# Patient Record
Sex: Male | Born: 1950 | Race: White | Hispanic: No | Smoking: Never smoker
Health system: Southern US, Community
[De-identification: ages and names within clinical notes are randomized; demographics above are authoritative.]

## PROBLEM LIST (undated history)

## (undated) DIAGNOSIS — Z87442 Personal history of urinary calculi: Secondary | ICD-10-CM

## (undated) DIAGNOSIS — F32A Depression, unspecified: Secondary | ICD-10-CM

## (undated) DIAGNOSIS — M1990 Unspecified osteoarthritis, unspecified site: Secondary | ICD-10-CM

## (undated) DIAGNOSIS — R339 Retention of urine, unspecified: Secondary | ICD-10-CM

## (undated) DIAGNOSIS — F329 Major depressive disorder, single episode, unspecified: Secondary | ICD-10-CM

## (undated) DIAGNOSIS — N200 Calculus of kidney: Secondary | ICD-10-CM

## (undated) DIAGNOSIS — N400 Enlarged prostate without lower urinary tract symptoms: Secondary | ICD-10-CM

## (undated) DIAGNOSIS — E785 Hyperlipidemia, unspecified: Secondary | ICD-10-CM

## (undated) DIAGNOSIS — F419 Anxiety disorder, unspecified: Secondary | ICD-10-CM

## (undated) DIAGNOSIS — R011 Cardiac murmur, unspecified: Secondary | ICD-10-CM

## (undated) DIAGNOSIS — I10 Essential (primary) hypertension: Secondary | ICD-10-CM

## (undated) HISTORY — DX: Depression, unspecified: F32.A

## (undated) HISTORY — DX: Cardiac murmur, unspecified: R01.1

## (undated) HISTORY — PX: TONSILLECTOMY: SUR1361

## (undated) HISTORY — DX: Benign prostatic hyperplasia without lower urinary tract symptoms: N40.0

## (undated) HISTORY — DX: Unspecified osteoarthritis, unspecified site: M19.90

## (undated) HISTORY — DX: Hyperlipidemia, unspecified: E78.5

## (undated) HISTORY — DX: Personal history of urinary calculi: Z87.442

## (undated) HISTORY — PX: CARDIAC SURGERY: SHX584

## (undated) HISTORY — DX: Major depressive disorder, single episode, unspecified: F32.9

## (undated) HISTORY — DX: Calculus of kidney: N20.0

## (undated) HISTORY — DX: Retention of urine, unspecified: R33.9

## (undated) HISTORY — DX: Anxiety disorder, unspecified: F41.9

---

## 2011-12-04 ENCOUNTER — Emergency Department: Payer: Self-pay | Admitting: Emergency Medicine

## 2014-04-14 DIAGNOSIS — Z8719 Personal history of other diseases of the digestive system: Secondary | ICD-10-CM | POA: Insufficient documentation

## 2014-05-05 ENCOUNTER — Ambulatory Visit: Payer: Self-pay | Admitting: Gastroenterology

## 2014-05-22 ENCOUNTER — Emergency Department: Payer: Self-pay | Admitting: Emergency Medicine

## 2014-06-14 DIAGNOSIS — L0291 Cutaneous abscess, unspecified: Secondary | ICD-10-CM | POA: Insufficient documentation

## 2014-06-14 DIAGNOSIS — K5792 Diverticulitis of intestine, part unspecified, without perforation or abscess without bleeding: Secondary | ICD-10-CM | POA: Insufficient documentation

## 2014-06-14 DIAGNOSIS — R509 Fever, unspecified: Secondary | ICD-10-CM | POA: Insufficient documentation

## 2014-06-14 DIAGNOSIS — I10 Essential (primary) hypertension: Secondary | ICD-10-CM | POA: Insufficient documentation

## 2014-06-15 DIAGNOSIS — M545 Low back pain, unspecified: Secondary | ICD-10-CM | POA: Insufficient documentation

## 2014-06-21 DIAGNOSIS — I330 Acute and subacute infective endocarditis: Secondary | ICD-10-CM | POA: Insufficient documentation

## 2014-06-30 LAB — SURGICAL PATHOLOGY

## 2014-08-21 DIAGNOSIS — I340 Nonrheumatic mitral (valve) insufficiency: Secondary | ICD-10-CM | POA: Insufficient documentation

## 2014-08-21 DIAGNOSIS — Z952 Presence of prosthetic heart valve: Secondary | ICD-10-CM | POA: Insufficient documentation

## 2014-08-21 DIAGNOSIS — G061 Intraspinal abscess and granuloma: Secondary | ICD-10-CM | POA: Insufficient documentation

## 2014-08-21 DIAGNOSIS — F32A Depression, unspecified: Secondary | ICD-10-CM | POA: Insufficient documentation

## 2014-08-21 DIAGNOSIS — I639 Cerebral infarction, unspecified: Secondary | ICD-10-CM | POA: Insufficient documentation

## 2014-08-21 DIAGNOSIS — F329 Major depressive disorder, single episode, unspecified: Secondary | ICD-10-CM | POA: Insufficient documentation

## 2015-03-14 ENCOUNTER — Encounter: Payer: Self-pay | Admitting: Emergency Medicine

## 2015-03-14 ENCOUNTER — Emergency Department: Payer: Medicare Other

## 2015-03-14 ENCOUNTER — Emergency Department
Admission: EM | Admit: 2015-03-14 | Discharge: 2015-03-14 | Disposition: A | Discharge status: Home / Self Care | Payer: Medicare Other | Attending: Student | Admitting: Student

## 2015-03-14 DIAGNOSIS — I10 Essential (primary) hypertension: Secondary | ICD-10-CM | POA: Diagnosis not present

## 2015-03-14 DIAGNOSIS — R1011 Right upper quadrant pain: Secondary | ICD-10-CM | POA: Diagnosis present

## 2015-03-14 DIAGNOSIS — R1013 Epigastric pain: Secondary | ICD-10-CM

## 2015-03-14 DIAGNOSIS — N200 Calculus of kidney: Secondary | ICD-10-CM

## 2015-03-14 DIAGNOSIS — R52 Pain, unspecified: Secondary | ICD-10-CM

## 2015-03-14 DIAGNOSIS — R1111 Vomiting without nausea: Secondary | ICD-10-CM

## 2015-03-14 HISTORY — DX: Essential (primary) hypertension: I10

## 2015-03-14 LAB — CBC WITH DIFFERENTIAL/PLATELET
Basophils Absolute: 0.1 10*3/uL (ref 0–0.1)
Basophils Relative: 1 %
EOS PCT: 0 %
Eosinophils Absolute: 0 10*3/uL (ref 0–0.7)
HCT: 37.8 % — ABNORMAL LOW (ref 40.0–52.0)
Hemoglobin: 12.6 g/dL — ABNORMAL LOW (ref 13.0–18.0)
LYMPHS ABS: 0.6 10*3/uL — AB (ref 1.0–3.6)
LYMPHS PCT: 5 %
MCH: 28.7 pg (ref 26.0–34.0)
MCHC: 33.2 g/dL (ref 32.0–36.0)
MCV: 86.5 fL (ref 80.0–100.0)
MONO ABS: 0.7 10*3/uL (ref 0.2–1.0)
Monocytes Relative: 6 %
Neutro Abs: 11.1 10*3/uL — ABNORMAL HIGH (ref 1.4–6.5)
Neutrophils Relative %: 88 %
PLATELETS: 297 10*3/uL (ref 150–440)
RBC: 4.37 MIL/uL — ABNORMAL LOW (ref 4.40–5.90)
RDW: 14.5 % (ref 11.5–14.5)
WBC: 12.6 10*3/uL — ABNORMAL HIGH (ref 3.8–10.6)

## 2015-03-14 LAB — COMPREHENSIVE METABOLIC PANEL
ALT: 22 U/L (ref 17–63)
ANION GAP: 6 (ref 5–15)
AST: 28 U/L (ref 15–41)
Albumin: 3.9 g/dL (ref 3.5–5.0)
Alkaline Phosphatase: 66 U/L (ref 38–126)
BUN: 25 mg/dL — ABNORMAL HIGH (ref 6–20)
CALCIUM: 8.9 mg/dL (ref 8.9–10.3)
CHLORIDE: 106 mmol/L (ref 101–111)
CO2: 23 mmol/L (ref 22–32)
CREATININE: 1.16 mg/dL (ref 0.61–1.24)
Glucose, Bld: 146 mg/dL — ABNORMAL HIGH (ref 65–99)
Potassium: 4.1 mmol/L (ref 3.5–5.1)
Sodium: 135 mmol/L (ref 135–145)
Total Bilirubin: 0.4 mg/dL (ref 0.3–1.2)
Total Protein: 6.7 g/dL (ref 6.5–8.1)

## 2015-03-14 LAB — URINALYSIS COMPLETE WITH MICROSCOPIC (ARMC ONLY)
BACTERIA UA: NONE SEEN
Bilirubin Urine: NEGATIVE
GLUCOSE, UA: NEGATIVE mg/dL
HGB URINE DIPSTICK: NEGATIVE
LEUKOCYTES UA: NEGATIVE
NITRITE: NEGATIVE
PH: 6 (ref 5.0–8.0)
Protein, ur: NEGATIVE mg/dL
SPECIFIC GRAVITY, URINE: 1.024 (ref 1.005–1.030)
SQUAMOUS EPITHELIAL / LPF: NONE SEEN

## 2015-03-14 LAB — PROTIME-INR
INR: 1.13
PROTHROMBIN TIME: 14.7 s (ref 11.4–15.0)

## 2015-03-14 LAB — APTT: APTT: 24 s (ref 24–36)

## 2015-03-14 LAB — MAGNESIUM: MAGNESIUM: 1.9 mg/dL (ref 1.7–2.4)

## 2015-03-14 LAB — ETHANOL

## 2015-03-14 LAB — LIPASE, BLOOD: LIPASE: 36 U/L (ref 11–51)

## 2015-03-14 MED ORDER — OXYCODONE HCL 5 MG PO TABS: 5.0000 mg | ORAL_TABLET | Freq: Four times a day (QID) | ORAL | Status: DC | PRN

## 2015-03-14 MED ORDER — ONDANSETRON HCL 4 MG/2ML IJ SOLN
4.0000 mg | Freq: Once | INTRAMUSCULAR | Status: AC
  Administered 2015-03-14: 4 mg via INTRAVENOUS
  Filled 2015-03-14: qty 2

## 2015-03-14 MED ORDER — MORPHINE SULFATE (PF) 4 MG/ML IV SOLN
4.0000 mg | Freq: Once | INTRAVENOUS | Status: AC
  Administered 2015-03-14: 4 mg via INTRAVENOUS
  Filled 2015-03-14: qty 1

## 2015-03-14 MED ORDER — ONDANSETRON 4 MG PO TBDP: 4.0000 mg | ORAL_TABLET | Freq: Three times a day (TID) | ORAL | Status: DC | PRN

## 2015-03-14 MED ORDER — SODIUM CHLORIDE 0.9 % IV BOLUS (SEPSIS)
500.0000 mL | Freq: Once | INTRAVENOUS | Status: AC
  Administered 2015-03-14: 500 mL via INTRAVENOUS

## 2015-03-14 MED ORDER — TAMSULOSIN HCL 0.4 MG PO CAPS: 0.4000 mg | ORAL_CAPSULE | Freq: Every day | ORAL | Status: DC

## 2015-03-14 NOTE — ED Notes (Signed)
Pt arrived by EMS with c/o of upper right quadrant pain. States it started around midnight and he has vomited twice. Hx of HTN, and valve replacement a month ago. Per EMS pt has a hx of EOTH use but pt states he has had little to drink over the past few days. VS's WDL.

## 2015-03-14 NOTE — ED Notes (Signed)
Pt verbalized understanding of discharge instructions. NAD at this time. 

## 2015-03-14 NOTE — ED Provider Notes (Signed)
[facility] Provider Note  ____________________________________________  Time seen: Approximately 8:31 AM  I have reviewed the triage vital signs and the nursing notes.   HISTORY  Chief Complaint Abdominal Pain    HPI [facility] is a 65 y.o. 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 He also has had 2 episodes of nonbloody nonbilious emesis. No diarrhea. No chest pain or difficulty breathing, no coughing, sneezing, runny nose, congestion. He has had some dysuria. He reports he drinks alcohol daily, including "a few sips" earlier this morning.   Past Medical History  Diagnosis Date  . Hypertension     There are no active problems to display for this patient.   Past Surgical History  Procedure Laterality Date  . Cardiac surgery      No current outpatient prescriptions on file.  Allergies Review of patient's allergies indicates no known allergies.  History reviewed. No pertinent family history.  Social History Social History  Substance Use Topics  . Smoking status: Never Smoker   . Smokeless tobacco: None  . Alcohol Use: Yes    Review of Systems Constitutional: No fever/chills Eyes: No visual changes. ENT: No sore throat. Cardiovascular: Denies chest pain. Respiratory: Denies shortness of breath. Gastrointestinal: + abdominal pain.  + nausea, n+ vomiting.  No diarrhea.  No constipation. Genitourinary: Positive for dysuria. Musculoskeletal: Negative for back pain. Skin: Negative for rash. Neurological: Negative for headaches, focal weakness or  numbness.  10-point ROS otherwise negative.  ____________________________________________   PHYSICAL EXAM:  VITAL SIGNS: ED Triage Vitals  Enc Vitals Group     BP 03/14/15 0833 133/97 mmHg     Pulse Rate 03/14/15 0833 76     Resp 03/14/15 0833 16     Temp 03/14/15 0833 97.4 F (36.3 C)     Temp Source 03/14/15 0833 Oral     SpO2 --      Weight 03/14/15 0833 150 lb (68.04 kg)     Height 03/14/15 0833 5\' 6"  (1.676 m)     Head Cir --      Peak Flow --      Pain Score 03/14/15 0838 2     Pain Loc --      Pain Edu? --      Excl. in [place]? --     Constitutional: Alert and oriented. Nontoxic appearing and in no acute distress. Eyes: Conjunctivae are normal. PERRL. EOMI. Head: Atraumatic. Nose: No congestion/rhinnorhea. Mouth/Throat: Mucous membranes are moist.  Oropharynx non-erythematous. Neck: No stridor.   Cardiovascular: Normal rate, regular rhythm. Grossly normal heart sounds.  Good peripheral circulation. Respiratory: Normal respiratory effort.  No retractions. Lungs CTAB. Gastrointestinal: Soft with faint tenderness to palpation in the epigastrium and the right upper quadrant, no rebound, no guarding. No tenderness in the right lower quadrant. No distention.  No CVA tenderness. Genitourinary: deferred Musculoskeletal: No lower extremity tenderness nor edema.  No joint effusions. Neurologic:  Normal speech and language. No gross focal neurologic deficits are appreciated. No gait instability. Skin:  Skin is warm, dry and intact. No rash noted. Psychiatric: Mood and affect are normal. Speech and behavior are normal.  ____________________________________________   LABS (all labs ordered are listed,  but only abnormal results are displayed)  Labs Reviewed  URINALYSIS COMPLETEWITH MICROSCOPIC ([place] ONLY) - Abnormal; Notable for the following:    Color, Urine YELLOW (*)    APPearance CLEAR (*)    Ketones, ur 2+ (*)    All other components within normal limits   COMPREHENSIVE METABOLIC PANEL - Abnormal; Notable for the following:    Glucose, Bld 146 (*)    BUN 25 (*)    All other components within normal limits  CBC WITH DIFFERENTIAL/PLATELET - Abnormal; Notable for the following:    WBC 12.6 (*)    RBC 4.37 (*)    Hemoglobin 12.6 (*)    HCT 37.8 (*)    Neutro Abs 11.1 (*)    Lymphs Abs 0.6 (*)    All other components within normal limits  MAGNESIUM  LIPASE, BLOOD  PROTIME-INR  APTT  CBC WITH DIFFERENTIAL/PLATELET  ETHANOL   ____________________________________________  EKG  ED ECG REPORT I, Joanne Gavel, the attending physician, personally viewed and interpreted this ECG.   Date: 03/14/2015  EKG Time: 08:34  Rate: 75  Rhythm: normal sinus rhythm  Axis: Normal axis  Intervals: QTc is slightly prolonged at 504 ms.  ST&T Change: No acute ST elevation. No specific T wave abnormality in lead 3, V3.  ____________________________________________  RADIOLOGY  RUQ ultrasound ____________________________________________   PROCEDURES  Procedure(s) performed: None  Critical Care performed: No  ____________________________________________   INITIAL IMPRESSION / ASSESSMENT AND PLAN / ED COURSE  Pertinent labs & imaging results that were available during my care of the patient were reviewed by me and considered in my medical decision making (see chart for details).  [name] is a 65 y.o. 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 On exam, he is nontoxic appearing and in no acute distress. Vital signs are stable, he is afebrile. He does have faint tenderness to palpation in the epigastrium and the right upper quadrant, no rebound, no guarding. We'll treat his pain, plan for screening labs  and UA, right upper quadrant ultrasound, reassess for disposition.  ----------------------------------------- 11:40 AM on 03/14/2015 -----------------------------------------  Reports significant improvement of his pain at this time. He is sitting up in bed, tolerating by mouth intake. Labs reviewed. Normal CMP, lipase, INR 1.13, CBC notable for mild leukocytosis and mild anemia. Normal magnesium level. Right upper quadrant ultrasound was normal. Urinalysis is not consistent with infection but many red blood cells were noted. CT of the abdomen and pelvis, renal protocol, was obtained and revealed 4 mm distal right ureteral stone resulting in mild right hydroureteronephrosis. The patient continues to appear well. His pain is well controlled. We discussed expectant management, return precautions, need for close urology follow-up, and he is comfortable with the discharge plan. DC home. Of note, his ethanol level is pending but he is clinically sober and has a friend here who will drive him home. ____________________________________________   FINAL CLINICAL IMPRESSION(S) / ED DIAGNOSES  Final diagnoses:  Pain  Acute epigastric pain  RUQ pain  Non-intractable vomiting without nausea, unspecified vomiting type  Nephrolithiasis      Joanne Gavel, MD 03/14/15 1143

## 2015-04-13 ENCOUNTER — Encounter: Payer: Self-pay | Admitting: *Deleted

## 2015-04-16 ENCOUNTER — Ambulatory Visit (INDEPENDENT_AMBULATORY_CARE_PROVIDER_SITE_OTHER): Payer: Medicare Other | Admitting: Urology

## 2015-04-16 ENCOUNTER — Encounter: Payer: Self-pay | Admitting: Urology

## 2015-04-16 VITALS — BP 132/91 | HR 91 | Ht 66.0 in | Wt 169.1 lb

## 2015-04-16 DIAGNOSIS — N132 Hydronephrosis with renal and ureteral calculous obstruction: Secondary | ICD-10-CM

## 2015-04-16 DIAGNOSIS — N401 Enlarged prostate with lower urinary tract symptoms: Secondary | ICD-10-CM

## 2015-04-16 DIAGNOSIS — N138 Other obstructive and reflux uropathy: Secondary | ICD-10-CM

## 2015-04-16 DIAGNOSIS — N201 Calculus of ureter: Secondary | ICD-10-CM | POA: Diagnosis not present

## 2015-04-16 LAB — URINALYSIS, COMPLETE
BILIRUBIN UA: NEGATIVE
GLUCOSE, UA: NEGATIVE
KETONES UA: NEGATIVE
Leukocytes, UA: NEGATIVE
NITRITE UA: NEGATIVE
PH UA: 7 (ref 5.0–7.5)
Protein, UA: NEGATIVE
RBC UA: NEGATIVE
Specific Gravity, UA: 1.015 (ref 1.005–1.030)
UUROB: 0.2 mg/dL (ref 0.2–1.0)

## 2015-04-16 LAB — MICROSCOPIC EXAMINATION
BACTERIA UA: NONE SEEN
Epithelial Cells (non renal): NONE SEEN /hpf (ref 0–10)
RBC, UA: NONE SEEN /hpf (ref 0–?)

## 2015-04-16 NOTE — Progress Notes (Signed)
04/16/2015 11:35 AM   [facility] March 20, 1950 [id number]  Referring provider: Ellamae Sia, MD [place], [place] [address]  Chief Complaint  Patient presents with  . Nephrolithiasis    kidney stone follow up    HPI: Patient is a 65 year old Caucasian male who was seen a [facility]'s ED on 03/14/2015 for right abdominal pain with vomiting.  Non- contrast CT scan noted a 4 mm stone within the distal right ureter resulting in mild right hydroureteronephrosis.   He also was found to have multiple additional bilateral renal stones.    He does not recall passing a stone.  He has not had further flank pain.  He is not experiencing gross hematuria, dysuria or suprapubic pain.  He does not complain of fevers, chills, nausea and vomiting.  His UA is unremarkable.    Patient has baseline frequent urination, urgency, nocturia and postvoid dribbling. He is I PSS score is 11/3.  He is currently on tamsulosin 0.4 mg daily.  This was prescribed to him by the emergency room department.      IPSS      04/16/15 1100       International Prostate Symptom Score   How often have you had the sensation of not emptying your bladder? Not at All     How often have you had to urinate less than every two hours? More than half the time     How often have you found you stopped and started again several times when you urinated? Less than 1 in 5 times     How often have you found it difficult to postpone urination? Less than half the time     How often have you had a weak urinary stream? Less than 1 in 5 times     How often have you had to strain to start urination? Not at All     How many times did you typically get up at night to urinate? 3 Times     Total IPSS Score 11     Quality of Life due to urinary symptoms   If you were to spend the rest of your life with your urinary condition just the way it is now how would you feel about that? Mixed        Score:  1-7 Mild 8-19  Moderate 20-35 Severe  PMH: Past Medical History  Diagnosis Date  . Hypertension   . Anxiety   . Depression   . Heart murmur   . HLD (hyperlipidemia)   . History of nephrolithiasis   . Incomplete bladder emptying   . BPH (benign prostatic hyperplasia)   . DJD (degenerative joint disease)     spine  . Bilateral kidney stones     Surgical History: Past Surgical History  Procedure Laterality Date  . Cardiac surgery    . Tonsillectomy      Home Medications:    Medication List       This list is accurate as of: 04/16/15 11:35 AM.  Always use your most recent med list.               acetaminophen 325 MG tablet  Commonly known as:  TYLENOL  Take 650 mg by mouth. Reported on 04/16/2015     aspirin 81 MG chewable tablet  Chew 324 mg by mouth.     atorvastatin 40 MG tablet  Commonly known as:  LIPITOR  Take by mouth. Reported on  04/16/2015     DULoxetine 20 MG capsule  Commonly known as:  CYMBALTA  Take 20 mg by mouth.     ondansetron 4 MG disintegrating tablet  Commonly known as:  ZOFRAN ODT  Take 1 tablet (4 mg total) by mouth every 8 (eight) hours as needed for nausea or vomiting.     oxyCODONE 5 MG immediate release tablet  Commonly known as:  ROXICODONE  Take 1 tablet (5 mg total) by mouth every 6 (six) hours as needed for moderate pain. Do not drive or drink alcohol while taking this medication.     polyethylene glycol powder powder  Commonly known as:  GLYCOLAX/MIRALAX  Reported on 04/16/2015     quinapril 10 MG tablet  Commonly known as:  ACCUPRIL  Take by mouth. Reported on 04/16/2015     tamsulosin 0.4 MG Caps capsule  Commonly known as:  FLOMAX  Take 1 capsule (0.4 mg total) by mouth daily.     TOPROL XL 25 MG 24 hr tablet  Generic drug:  metoprolol succinate  Take 25 mg by mouth.        Allergies: No Known Allergies  Family History: Family History  Problem Relation Age of Onset  . Prostate cancer Father   . Kidney disease Father      Social History:  reports that he has never smoked. He does not have any smokeless tobacco history on file. He reports that he drinks alcohol. He reports that he uses illicit drugs (Marijuana).  ROS: UROLOGY Frequent Urination?: Yes Hard to postpone urination?: Yes Burning/pain with urination?: No Get up at night to urinate?: Yes Leakage of urine?: Yes Urine stream starts and stops?: No Trouble starting stream?: No Do you have to strain to urinate?: No Blood in urine?: No Urinary tract infection?: No Sexually transmitted disease?: No Injury to kidneys or bladder?: No Painful intercourse?: No Weak stream?: No Erection problems?: Yes Penile pain?: No  Gastrointestinal Nausea?: No Vomiting?: No Indigestion/heartburn?: No Diarrhea?: No Constipation?: No  Constitutional Fever: No Night sweats?: No Weight loss?: No Fatigue?: No  Skin Skin rash/lesions?: No Itching?: No  Eyes Blurred vision?: Yes Double vision?: No  Ears/Nose/Throat Sore throat?: No Sinus problems?: No  Hematologic/Lymphatic Swollen glands?: No Easy bruising?: No  Cardiovascular Leg swelling?: No Chest pain?: No  Respiratory Cough?: No Shortness of breath?: No  Endocrine Excessive thirst?: Yes  Musculoskeletal Back pain?: No Joint pain?: No  Neurological Headaches?: No Dizziness?: No  Psychologic Depression?: No Anxiety?: No  Physical Exam: BP 132/91 mmHg  Pulse 91  Ht 5\' 6"  (1.676 m)  Wt 169 lb 1.6 oz (76.703 kg)  BMI 27.31 kg/m2  Constitutional: Well nourished. Alert and oriented, No acute distress. HEENT: [place] AT, moist mucus membranes. Trachea midline, no masses. Cardiovascular: No clubbing, cyanosis, or edema. Respiratory: Normal respiratory effort, no increased work of breathing. GI: Abdomen is soft, non tender, non distended, no abdominal masses. Liver and spleen not palpable.  No hernias appreciated.  Stool sample for occult testing is not indicated.   GU: No  CVA tenderness.  No bladder fullness or masses.  Patient with circumcised phallus.   Urethral meatus is patent.  No penile discharge. No penile lesions or rashes. Scrotum without lesions, cysts, rashes and/or edema.  Testicles are located scrotally bilaterally. No masses are appreciated in the testicles. Left and right epididymis are normal. Rectal: Patient with  normal sphincter tone. Anus and perineum without scarring or rashes. No rectal masses are appreciated. Prostate is approximately  60 grams, no nodules are appreciated. Seminal vesicles are normal. Skin: No rashes, bruises or suspicious lesions. Lymph: No cervical or inguinal adenopathy. Neurologic: Grossly intact, no focal deficits, moving all 4 extremities. Psychiatric: Normal mood and affect.  Laboratory Data: Lab Results  Component Value Date   WBC 12.6* 03/14/2015   HGB 12.6* 03/14/2015   HCT 37.8* 03/14/2015   MCV 86.5 03/14/2015   PLT 297 03/14/2015   Lab Results  Component Value Date   CREATININE 1.16 03/14/2015   Lab Results  Component Value Date   AST 28 03/14/2015   Lab Results  Component Value Date   ALT 22 03/14/2015   PSA History  2.3 ng/mL on 06/06/2014  Urinalysis  Results for orders placed or performed in visit on 04/16/15  Microscopic Examination  Result Value Ref Range   WBC, UA 0-5 0 -  5 /hpf   RBC, UA None seen 0 -  2 /hpf   Epithelial Cells (non renal) None seen 0 - 10 /hpf   Bacteria, UA None seen None seen/Few   Urinalysis, Complete  Result Value Ref Range   Specific Gravity, UA 1.015 1.005 - 1.030   pH, UA 7.0 5.0 - 7.5   Color, UA Yellow Yellow   Appearance Ur Clear Clear   Leukocytes, UA Negative Negative   Protein, UA Negative Negative/Trace   Glucose, UA Negative Negative   Ketones, UA Negative Negative   RBC, UA Negative Negative   Bilirubin, UA Negative Negative   Urobilinogen, Ur 0.2 0.2 - 1.0 mg/dL   Nitrite, UA Negative Negative   Microscopic Examination See below:      Pertinent Imaging: CLINICAL DATA: Patient with right upper quadrant pain. Vomiting.  EXAM: CT ABDOMEN AND PELVIS WITHOUT CONTRAST  TECHNIQUE: Multidetector CT imaging of the abdomen and pelvis was performed following the standard protocol without IV contrast.  COMPARISON: CT abdomen pelvis 05/22/2014.  FINDINGS: Lower chest: Normal heart size. Minimal atelectasis left lung base. No pleural effusion.  Hepatobiliary: Liver is normal in size contour. Gallbladder is unremarkable. No biliary ductal dilatation.  Pancreas: Unremarkable  Spleen: Unremarkable  Adrenals/Urinary Tract: The adrenal glands are normal. Tiny 2-3 mm bilateral renal stones are demonstrated. Additionally there is mild right hydroureteronephrosis to the level of the distal ureter were there is an obstructing stone (image 66; series 2). Urinary bladder is decompressed.  Stomach/Bowel: Sigmoid colonic diverticulosis. No CT evidence for acute diverticulitis. No evidence for bowel obstruction. Normal morphology to the stomach. No free fluid or free intraperitoneal air.  Vascular/Lymphatic: Normal caliber abdominal aorta. Peripheral calcified atherosclerotic plaque. No retroperitoneal lymphadenopathy.  Other: Prostate unremarkable.  Musculoskeletal: Lumbar spine degenerative changes. No aggressive or acute appearing osseous lesions.  IMPRESSION: There is a 4 mm stone within the distal right ureter resulting in mild right hydroureteronephrosis.  Multiple additional bilateral renal stones are demonstrated.   Electronically Signed  By: Lovey Newcomer M.D.  On: 03/14/2015 11:15        Assessment & Plan:    1. Right ureteral stone:   Patient is not aware if he has passed the stone.  His UA was unremarkable on today's exam.  We will obtain a RUS to see if the right hydronephrosis has resolved.    2. BPH (benign prostatic hyperplasia) with LUTS:   IPSS score is 11/3.  He was  started on tamsulosin by the ED for the stone, but he has noticed an improvement in his LUTS.  He will continue the medication.    -  PSA - Urinalysis, Complete  3.  Right hydronephrosis:   Patient will be undergoing a RUS in the future.     Return in about 1 week (around 04/23/2015) for RUS report.  These notes generated with voice recognition software. I apologize for typographical errors.  Zara Council, [facility] [address] [place], [place] [address] [phone]

## 2015-04-17 LAB — PSA: PROSTATE SPECIFIC AG, SERUM: 2.4 ng/mL (ref 0.0–4.0)

## 2015-04-20 DIAGNOSIS — N201 Calculus of ureter: Secondary | ICD-10-CM | POA: Insufficient documentation

## 2015-04-20 DIAGNOSIS — N401 Enlarged prostate with lower urinary tract symptoms: Secondary | ICD-10-CM

## 2015-04-20 DIAGNOSIS — N138 Other obstructive and reflux uropathy: Secondary | ICD-10-CM | POA: Insufficient documentation

## 2015-04-20 DIAGNOSIS — N132 Hydronephrosis with renal and ureteral calculous obstruction: Secondary | ICD-10-CM | POA: Insufficient documentation

## 2015-04-21 ENCOUNTER — Telehealth: Payer: Self-pay

## 2015-04-21 NOTE — Telephone Encounter (Signed)
-----   Message from Nori Riis, PA-C sent at 04/21/2015  8:42 AM EST ----- Patient's PSA is normal.  He has a RUS pending.

## 2015-04-21 NOTE — Telephone Encounter (Signed)
Spoke with pt in reference to PSA and RUS. Pt voiced understanding.

## 2015-05-15 ENCOUNTER — Ambulatory Visit
Admission: RE | Admit: 2015-05-15 | Discharge: 2015-05-15 | Disposition: A | Discharge status: Home / Self Care | Payer: Medicare Other | Source: Ambulatory Visit | Attending: Urology | Admitting: Urology

## 2015-05-15 DIAGNOSIS — N132 Hydronephrosis with renal and ureteral calculous obstruction: Secondary | ICD-10-CM | POA: Diagnosis not present

## 2015-05-18 ENCOUNTER — Encounter: Payer: Self-pay | Admitting: Urology

## 2015-05-18 ENCOUNTER — Ambulatory Visit (INDEPENDENT_AMBULATORY_CARE_PROVIDER_SITE_OTHER): Payer: Medicare Other | Admitting: Urology

## 2015-05-18 VITALS — BP 129/91 | HR 76 | Ht 66.0 in | Wt 173.4 lb

## 2015-05-18 DIAGNOSIS — N201 Calculus of ureter: Secondary | ICD-10-CM

## 2015-05-18 DIAGNOSIS — N200 Calculus of kidney: Secondary | ICD-10-CM | POA: Insufficient documentation

## 2015-05-18 DIAGNOSIS — N132 Hydronephrosis with renal and ureteral calculous obstruction: Secondary | ICD-10-CM | POA: Diagnosis not present

## 2015-05-18 DIAGNOSIS — N401 Enlarged prostate with lower urinary tract symptoms: Secondary | ICD-10-CM

## 2015-05-18 DIAGNOSIS — N138 Other obstructive and reflux uropathy: Secondary | ICD-10-CM

## 2015-05-18 NOTE — Progress Notes (Signed)
2:13 PM   Ian Hubbard [birth year] [id number]  Referring provider: Ellamae Sia, MD [place],  [id number]  Chief Complaint  Patient presents with  . Follow-up    BPH    HPI: Patient is a 65 year old Caucasian male who presents today for a renal ultrasound report after he spontaneously passed right ureteral stone and for BPH with LUTS and being initiated on tamsulosin therapy.  Previous history Patient  was seen a [facility]'s ED on 03/14/2015 for right abdominal pain with vomiting.  Non- contrast CT scan noted a 4 mm stone within the distal right ureter resulting in mild right hydroureteronephrosis.   He also was found to have multiple additional bilateral renal stones.  He does not recall passing a stone.  He has not had further flank pain.  He is not experiencing gross hematuria, dysuria or suprapubic pain.  He does not complain of fevers, chills, nausea and vomiting.  His UA is unremarkable on  04/16/2015.  Patient has baseline frequent urination, urgency, nocturia and postvoid dribbling. He is I PSS score was 11/3 on 04/16/2015.  He is currently on tamsulosin 0.4 mg daily.  This was prescribed to him by the emergency room department.      IPSS      04/16/15 1100 05/18/15 1400     International Prostate Symptom Score   How often have you had the sensation of not emptying your bladder? Not at All Less than 1 in 5    How often have you had to urinate less than every two hours? More than half the time Almost always    How often have you found you stopped and started again several times when you urinated? Less than 1 in 5 times Less than 1 in 5 times    How often have you found it difficult to postpone urination? Less than half the time Less than 1 in 5 times    How often have you had a weak urinary stream? Less than 1 in 5 times Less than 1 in 5 times    How often have you had to strain to start urination? Not at All Less than 1 in 5 times    How  many times did you typically get up at night to urinate? 3 Times 3 Times    Total IPSS Score 11 13    Quality of Life due to urinary symptoms   If you were to spend the rest of your life with your urinary condition just the way it is now how would you feel about that? Mixed Mixed       Score:  1-7 Mild 8-19 Moderate 20-35 Severe  Today, he is not experiencing any flank pain or gross hematuria.  He is not experiencing any recent fevers, chills, nausea or vomiting.  RUS completed on 05/15/2015 noted no hydronephrosis. I have reviewed the films with the patient.  His I PSS score today is 13/3. He is satisfied with the tamsulosin and will continue the medication.     PMH: Past Medical History  Diagnosis Date  . Hypertension   . Anxiety   . Depression   . Heart murmur   . HLD (hyperlipidemia)   . History of nephrolithiasis   . Incomplete bladder emptying   . BPH (benign prostatic hyperplasia)   . DJD (degenerative joint disease)     spine  . Bilateral kidney stones     Surgical History: Past Surgical History  Procedure  Laterality Date  . Cardiac surgery    . Tonsillectomy      Home Medications:    Medication List       This list is accurate as of: 05/18/15  2:13 PM.  Always use your most recent med list.               acetaminophen 325 MG tablet  Commonly known as:  TYLENOL  Take 650 mg by mouth. Reported on 05/18/2015     aspirin 81 MG chewable tablet  Chew 324 mg by mouth.     atorvastatin 40 MG tablet  Commonly known as:  LIPITOR  Take by mouth. Reported on 04/16/2015     DULoxetine 20 MG capsule  Commonly known as:  CYMBALTA  Take 20 mg by mouth.     ondansetron 4 MG disintegrating tablet  Commonly known as:  ZOFRAN ODT  Take 1 tablet (4 mg total) by mouth every 8 (eight) hours as needed for nausea or vomiting.     oxyCODONE 5 MG immediate release tablet  Commonly known as:  ROXICODONE  Take 1 tablet (5 mg total) by mouth every 6 (six) hours as  needed for moderate pain. Do not drive or drink alcohol while taking this medication.     polyethylene glycol powder powder  Commonly known as:  GLYCOLAX/MIRALAX  Reported on 05/18/2015     quinapril 10 MG tablet  Commonly known as:  ACCUPRIL  Take by mouth. Reported on 05/18/2015     tamsulosin 0.4 MG Caps capsule  Commonly known as:  FLOMAX  Take 1 capsule (0.4 mg total) by mouth daily.     TOPROL XL 25 MG 24 hr tablet  Generic drug:  metoprolol succinate  Take 25 mg by mouth.        Allergies: No Known Allergies  Family History: Family History  Problem Relation Age of Onset  . Prostate cancer Father   . Kidney disease Father     Social History:  reports that he has never smoked. He does not have any smokeless tobacco history on file. He reports that he drinks alcohol. He reports that he uses illicit drugs (Marijuana).  ROS: UROLOGY Frequent Urination?: No Hard to postpone urination?: No Burning/pain with urination?: Yes Get up at night to urinate?: No Leakage of urine?: No Urine stream starts and stops?: No Trouble starting stream?: No Do you have to strain to urinate?: No Blood in urine?: No Urinary tract infection?: No Sexually transmitted disease?: No Injury to kidneys or bladder?: No Painful intercourse?: No Weak stream?: No Erection problems?: No Penile pain?: No  Gastrointestinal Nausea?: No Vomiting?: No Indigestion/heartburn?: No Diarrhea?: No Constipation?: No  Constitutional Fever: No Night sweats?: No Weight loss?: No Fatigue?: No  Skin Skin rash/lesions?: No Itching?: No  Eyes Blurred vision?: Yes Double vision?: No  Ears/Nose/Throat Sore throat?: No Sinus problems?: No  Hematologic/Lymphatic Swollen glands?: No Easy bruising?: No  Cardiovascular Leg swelling?: No Chest pain?: No  Respiratory Cough?: No Shortness of breath?: Yes  Endocrine Excessive thirst?: Yes  Musculoskeletal Back pain?: No Joint pain?:  No  Neurological Headaches?: No Dizziness?: Yes  Psychologic Depression?: No Anxiety?: Yes  Physical Exam: BP 129/91 mmHg  Pulse 76  Ht 5\' 6"  (1.676 m)  Wt 173 lb 6.4 oz (78.654 kg)  BMI 28.00 kg/m2  Constitutional: Well nourished. Alert and oriented, No acute distress. HEENT: [place] AT, moist mucus membranes. Trachea midline, no masses. Cardiovascular: No clubbing, cyanosis, or edema. Respiratory: Normal respiratory  effort, no increased work of breathing. Skin: No rashes, bruises or suspicious lesions. Lymph: No cervical or inguinal adenopathy. Neurologic: Grossly intact, no focal deficits, moving all 4 extremities. Psychiatric: Normal mood and affect.  Laboratory Data: Lab Results  Component Value Date   WBC 12.6* 03/14/2015   HGB 12.6* 03/14/2015   HCT 37.8* 03/14/2015   MCV 86.5 03/14/2015   PLT 297 03/14/2015   Lab Results  Component Value Date   CREATININE 1.16 03/14/2015   Lab Results  Component Value Date   AST 28 03/14/2015   Lab Results  Component Value Date   ALT 22 03/14/2015   PSA History  2.3 ng/mL on 06/06/2014  2.4 ng/mL on 04/16/2015    Pertinent Imaging:  CLINICAL DATA: Patient with right flank pain for 3 months. Evaluate for hydronephrosis.  EXAM: RENAL / URINARY TRACT ULTRASOUND COMPLETE  COMPARISON: CT abdomen pelvis 03/14/2015  FINDINGS: Right Kidney:  Length: 9.3 cm. Renal cortical thinning. No hydronephrosis.  Left Kidney:  Length: 12.0 cm. Echogenicity within normal limits. No mass or hydronephrosis visualized.  Bladder:  Appears normal for degree of bladder distention.  IMPRESSION: No hydronephrosis.   Electronically Signed  By: Lovey Newcomer M.D.  On: 05/15/2015 13:24   Assessment & Plan:    1. Right ureteral stone:   Patient's hydronephrosis has resolved.  Most likely spontaneously passed the right ureteral stone.  He will contact us with any episodes of flank pain or gross hematuria.     2. BPH (benign prostatic hyperplasia) with LUTS:   IPSS score is 13/3.  He continues to see improvement with the tamsulosin. He will continue the medication.  He will follow-up in one year for I PSS score, exam and PSA.  3.  Right hydronephrosis:   Has resolved.  4. Bilateral renal stones:    Patient has bilateral stones.  He does not want any intervention for these stones at this time. He does not want to pursue a 24-hour urine at this time. He'll return in 1 year with a KUB to monitor the stones. He will contact us if he develops any flank pain or gross hematuria prior to his follow-up appointment.   Return in about 1 year (around 05/17/2016) for IPSS score, KUB and exam.  These notes generated with voice recognition software. I apologize for typographical errors.  Zara Council, [facility] [address], [place], [place] [address] [phone]

## 2016-05-12 ENCOUNTER — Other Ambulatory Visit: Payer: Self-pay

## 2016-05-12 DIAGNOSIS — N401 Enlarged prostate with lower urinary tract symptoms: Secondary | ICD-10-CM

## 2016-05-13 ENCOUNTER — Other Ambulatory Visit: Payer: Medicare Other

## 2016-05-13 DIAGNOSIS — N401 Enlarged prostate with lower urinary tract symptoms: Secondary | ICD-10-CM

## 2016-05-14 LAB — PSA: PROSTATE SPECIFIC AG, SERUM: 2.5 ng/mL (ref 0.0–4.0)

## 2016-05-16 ENCOUNTER — Other Ambulatory Visit: Payer: Self-pay | Admitting: Urology

## 2016-05-16 DIAGNOSIS — N200 Calculus of kidney: Secondary | ICD-10-CM

## 2016-05-16 NOTE — Progress Notes (Deleted)
3:15 PM   Ian Hubbard 1950/10/06 [id number]  Referring provider: Ellamae Sia, MD [place], [place] [address]  No chief complaint on file.   HPI: 25 WM with bilateral nephrolithiasis and BPH with LU TS who presents today for a yearly follow up.    Bilateral nephrolithiasis Patient  was seen a [facility]'s ED on 03/14/2015 for right abdominal pain with vomiting.  Non- contrast CT scan noted a 4 mm stone within the distal right ureter resulting in mild right hydroureteronephrosis.   He also was found to have multiple additional bilateral renal stones.  RUS performed in 05/2015 noted no hydronephrosis.  KUB taken today noted ***.  He denies any gross hematuria or flank pain or passage of stone fragments since his appointment with [place] 1 year ago.  BPH WITH LUTS His IPSS score today is ***, which is *** lower urinary tract symptomatology. He is *** with his quality life due to his urinary symptoms. His PVR is *** mL.  His previous IPSS score was 13/3.  His previous PVR is *** mL.    His major complaint today ***.  He has had these symptoms for *** years.  He denies any dysuria, hematuria or suprapubic pain.   He currently taking ***.  His has had ***.  Previous PSA's:     He also denies any recent fevers, chills, nausea or vomiting.  He has a family history of PCa, with ***.   He does not have a family history of PCa.***    Score:  1-7 Mild 8-19 Moderate 20-35 Severe      PMH: Past Medical History:  Diagnosis Date  . Anxiety   . Bilateral kidney stones   . BPH (benign prostatic hyperplasia)   . Depression   . DJD (degenerative joint disease)    spine  . Heart murmur   . History of nephrolithiasis   . HLD (hyperlipidemia)   . Hypertension   . Incomplete bladder emptying     Surgical History: Past Surgical History:  Procedure Laterality Date  . CARDIAC SURGERY    . TONSILLECTOMY      Home Medications:  Allergies as of  05/17/2016   No Known Allergies     Medication List       Accurate as of 05/16/16  3:15 PM. Always use your most recent med list.          acetaminophen 325 MG tablet Commonly known as:  TYLENOL Take 650 mg by mouth. Reported on 05/18/2015   aspirin 81 MG chewable tablet Chew 324 mg by mouth.   atorvastatin 40 MG tablet Commonly known as:  LIPITOR Take by mouth. Reported on 04/16/2015   DULoxetine 20 MG capsule Commonly known as:  CYMBALTA Take 20 mg by mouth.   ondansetron 4 MG disintegrating tablet Commonly known as:  ZOFRAN ODT Take 1 tablet (4 mg total) by mouth every 8 (eight) hours as needed for nausea or vomiting.   oxyCODONE 5 MG immediate release tablet Commonly known as:  ROXICODONE Take 1 tablet (5 mg total) by mouth every 6 (six) hours as needed for moderate pain. Do not drive or drink alcohol while taking this medication.   polyethylene glycol powder powder Commonly known as:  GLYCOLAX/MIRALAX Reported on 05/18/2015   quinapril 10 MG tablet Commonly known as:  ACCUPRIL Take by mouth. Reported on 05/18/2015   tamsulosin 0.4 MG Caps capsule Commonly known as:  FLOMAX Take 1 capsule (0.4  mg total) by mouth daily.   TOPROL XL 25 MG 24 hr tablet Generic drug:  metoprolol succinate Take 25 mg by mouth.       Allergies: No Known Allergies  Family History: Family History  Problem Relation Age of Onset  . Prostate cancer Father   . Kidney disease Father     Social History:  reports that he has never smoked. He does not have any smokeless tobacco history on file. He reports that he drinks alcohol. He reports that he uses drugs, including Marijuana.  ROS:                                        Physical Exam: There were no vitals taken for this visit.  Constitutional: Well nourished. Alert and oriented, No acute distress. HEENT: [place] AT, moist mucus membranes. Trachea midline, no masses. Cardiovascular: No clubbing, cyanosis,  or edema. Respiratory: Normal respiratory effort, no increased work of breathing. Skin: No rashes, bruises or suspicious lesions. Lymph: No cervical or inguinal adenopathy. Neurologic: Grossly intact, no focal deficits, moving all 4 extremities. Psychiatric: Normal mood and affect.  Laboratory Data: Lab Results  Component Value Date   WBC 12.6 (H) 03/14/2015   HGB 12.6 (L) 03/14/2015   HCT 37.8 (L) 03/14/2015   MCV 86.5 03/14/2015   PLT 297 03/14/2015   Lab Results  Component Value Date   CREATININE 1.16 03/14/2015   Lab Results  Component Value Date   AST 28 03/14/2015   Lab Results  Component Value Date   ALT 22 03/14/2015   PSA History  2.3 ng/mL on 06/06/2014  2.4 ng/mL on 04/16/2015  2.5 ng/mL on 05/13/2016   Pertinent Imaging: ***   Assessment & Plan:    1.  Bilateral renal stones:  2. BPH with LUTS  - IPSS score is ***, it is stable/improving/worsening  - Continue conservative management, avoiding bladder irritants and timed voiding's  - most bothersome symptoms is/are ***  - Initiate alpha-blocker (***), discussed side effects  - Initiate 5 alpha reductase inhibitor (***), discussed side effects  - Continue tamsulosin 0.4 mg daily; refills given  - Cannot tolerate medication or medication failure, schedule cystoscopy ***  - RTC in *** months for IPSS, PSA, PVR and exam      No Follow-up on file.  These notes generated with voice recognition software. I apologize for typographical errors.  Zara Council, [facility] [address], [place], [place] [address] [phone]

## 2016-05-17 ENCOUNTER — Ambulatory Visit: Payer: Medicare Other | Admitting: Urology

## 2017-07-21 ENCOUNTER — Other Ambulatory Visit: Payer: Self-pay | Admitting: Internal Medicine

## 2017-07-21 DIAGNOSIS — R29818 Other symptoms and signs involving the nervous system: Secondary | ICD-10-CM

## 2017-08-01 ENCOUNTER — Ambulatory Visit
Admission: RE | Admit: 2017-08-01 | Discharge: 2017-08-01 | Disposition: A | Discharge status: Home / Self Care | Payer: Medicare Other | Source: Ambulatory Visit | Attending: Internal Medicine | Admitting: Internal Medicine

## 2017-08-23 ENCOUNTER — Ambulatory Visit: Payer: Medicare Other

## 2017-08-26 IMAGING — CT CT RENAL STONE PROTOCOL
1 of 2 series · 15 of 32 positions shown, 19 images · non-contrast
Comparison: CT abdomen pelvis 05/22/2014.

CLINICAL DATA: Patient with right upper quadrant pain.  Vomiting.

EXAM:
CT ABDOMEN AND PELVIS WITHOUT CONTRAST
TECHNIQUE: Multidetector CT imaging of the abdomen and pelvis was performed
following the standard protocol without IV contrast.

[Series 2: stone standard full · axial · 0.68mm/px · z∈[+174,+548]mm · 15 of 83 slices shown, 19 images]
[im 4/83  soft-tissue]
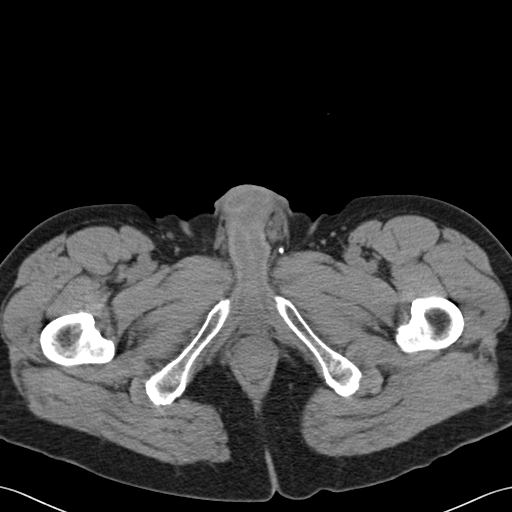
[im 4/83  bone]
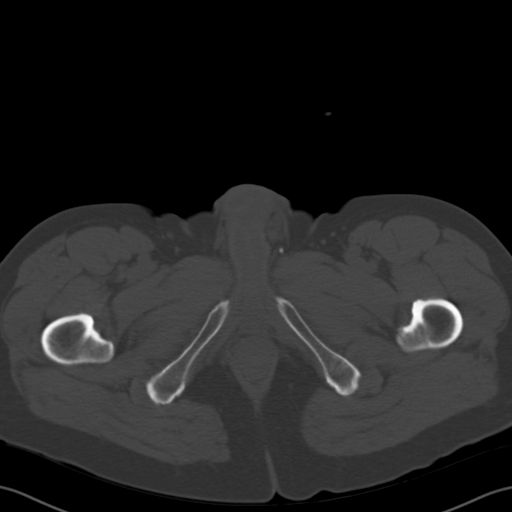
[im 11/83  soft-tissue]
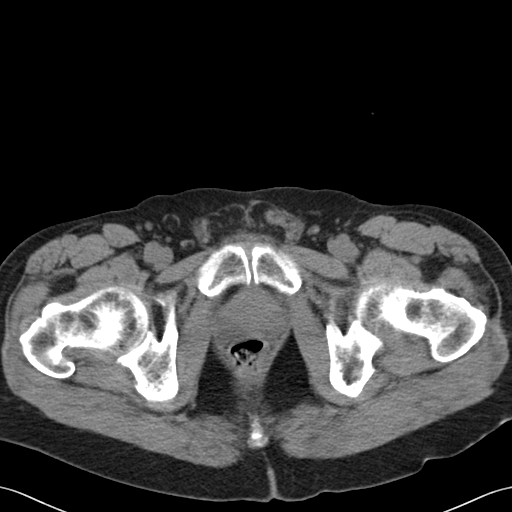
[im 18/83  soft-tissue]
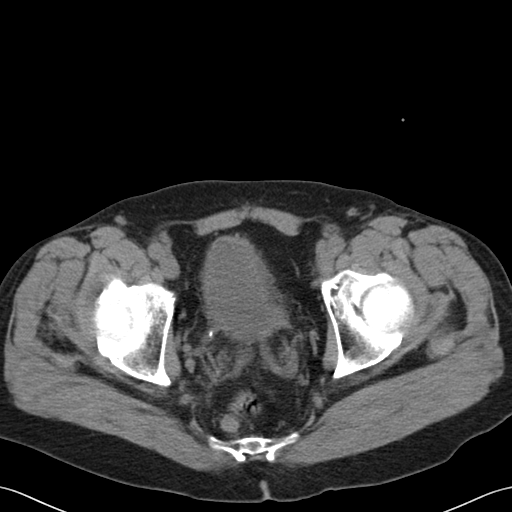
[im 24/83  soft-tissue]
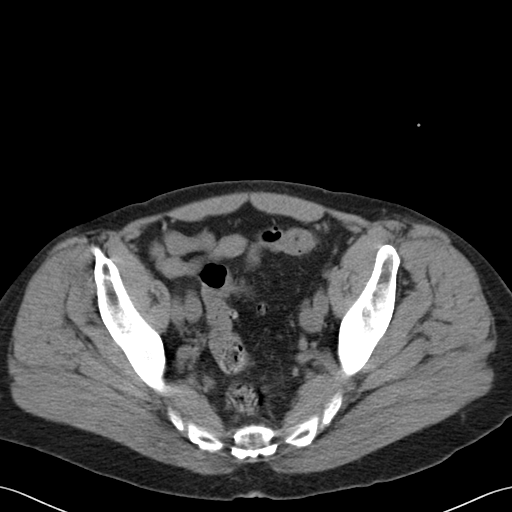
[im 28/83  soft-tissue]
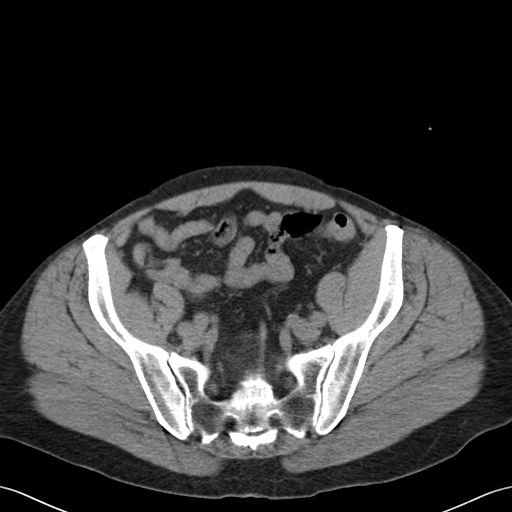
[im 35/83  soft-tissue]
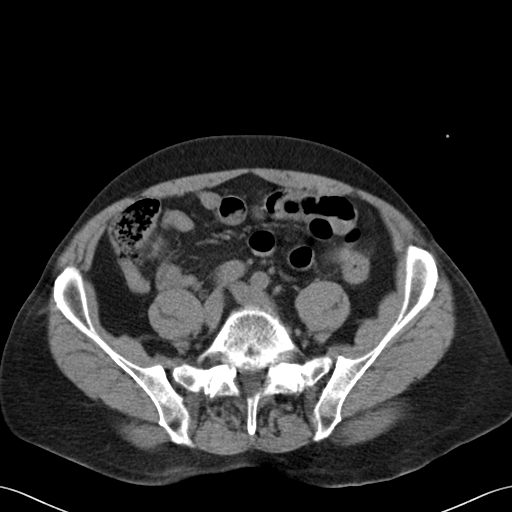
[im 42/83  soft-tissue]
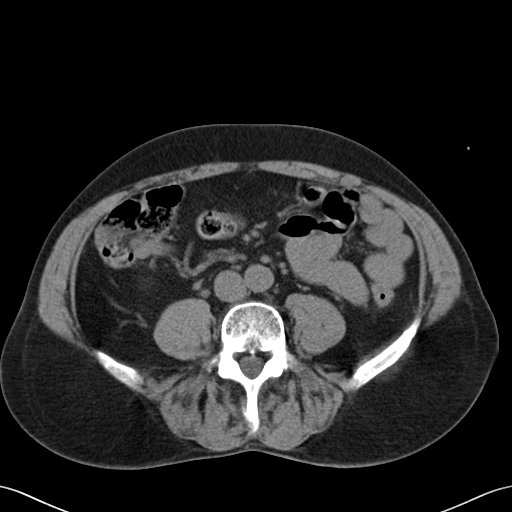
[im 48/83  soft-tissue]
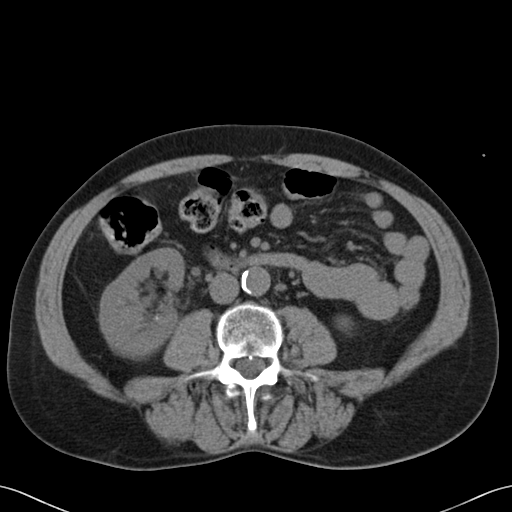
[im 55/83  soft-tissue]
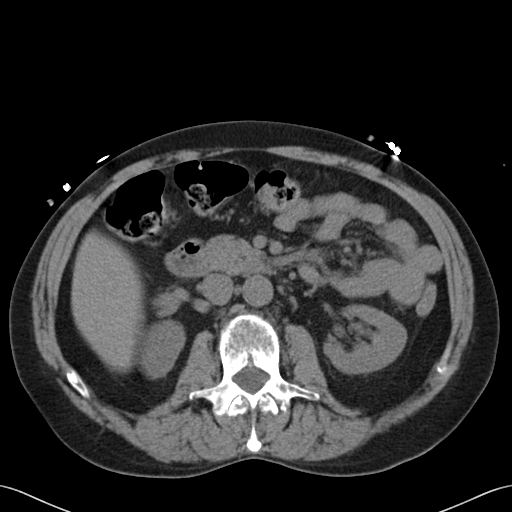
[im 55/83  bone]
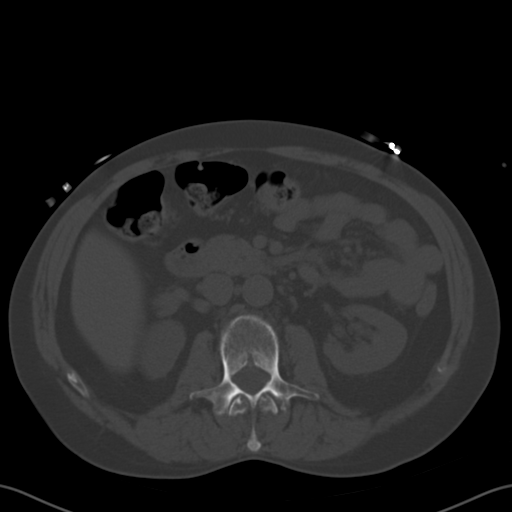
[im 59/83  soft-tissue]
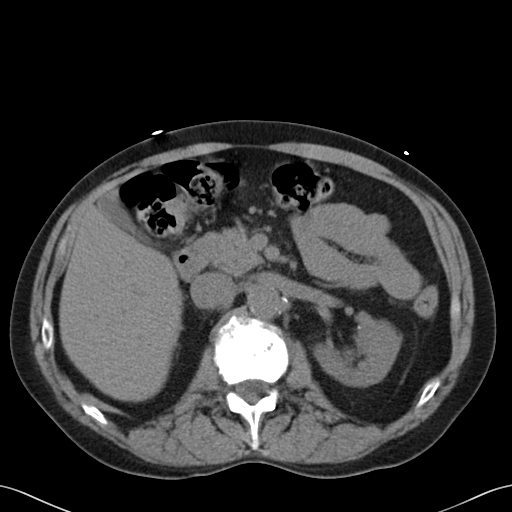
[im 65/83  soft-tissue]
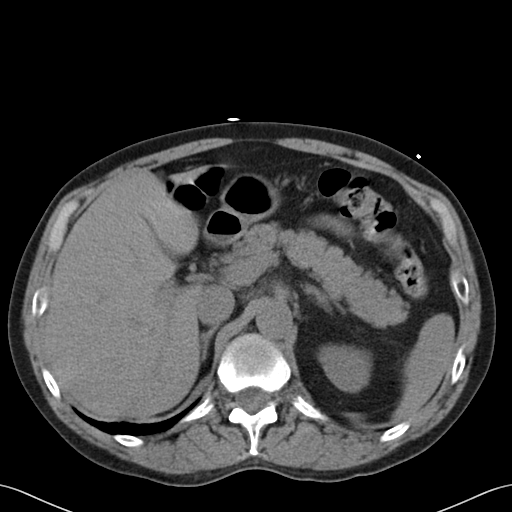
[im 69/83  lung]
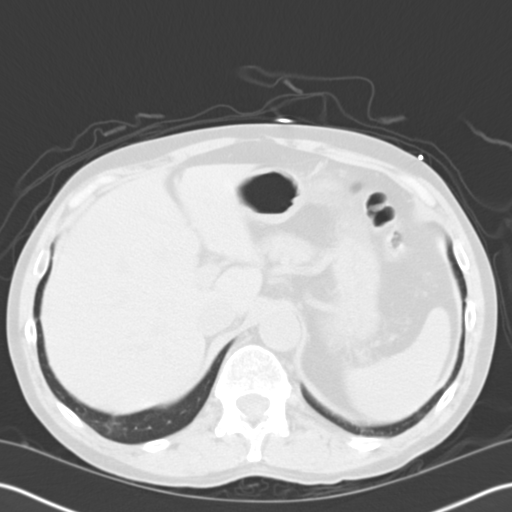
[im 72/83  soft-tissue]
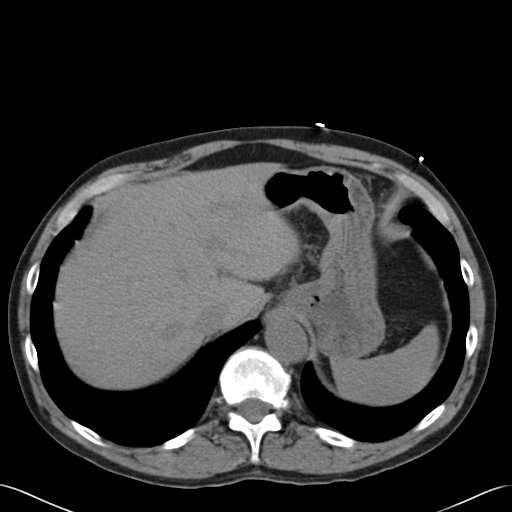
[im 72/83  lung]
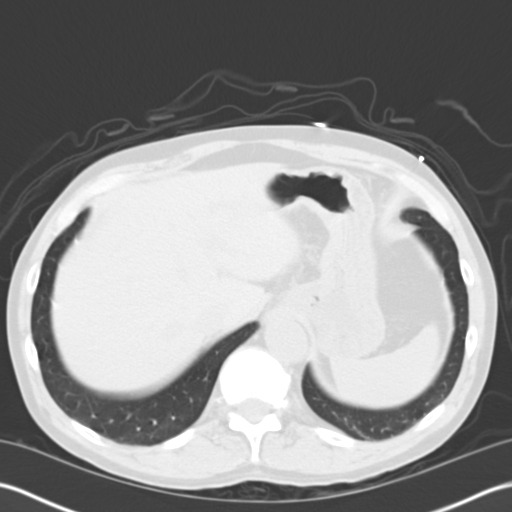
[im 76/83  lung]
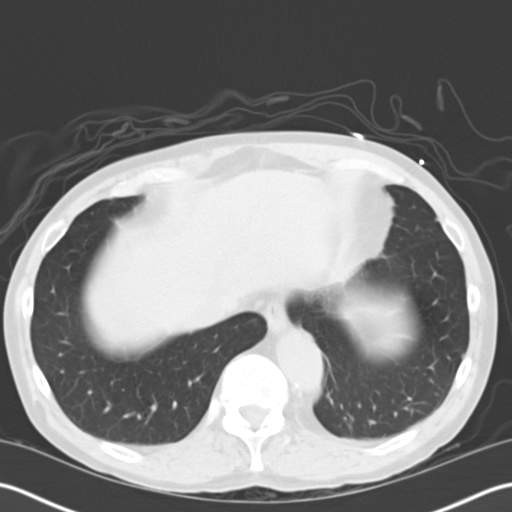
[im 79/83  soft-tissue]
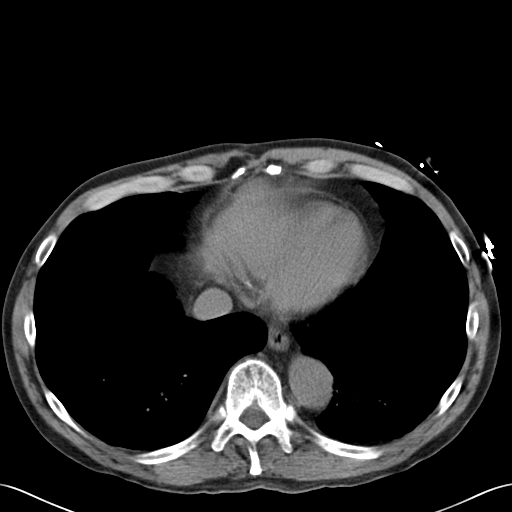
[im 79/83  lung]
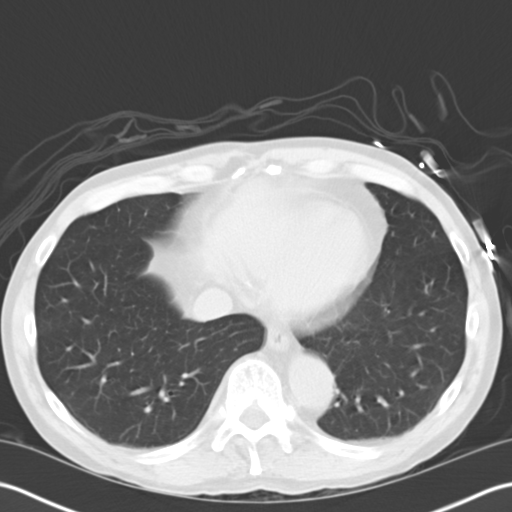

[15 of 32 positions shown; findings below may reference images not displayed]

FINDINGS: Lower chest: Normal heart size. Minimal atelectasis left lung base.
No pleural effusion.

Hepatobiliary: Liver is normal in size contour. Gallbladder is
unremarkable. No biliary ductal dilatation.

Pancreas: Unremarkable

Spleen: Unremarkable

Adrenals/Urinary Tract: The adrenal glands are normal. Tiny 2-3 mm
bilateral renal stones are demonstrated. Additionally there is mild
right hydroureteronephrosis to the level of the distal ureter were
there is an obstructing stone (image 66; series 2). Urinary bladder
is decompressed.

Stomach/Bowel: Sigmoid colonic diverticulosis. No CT evidence for
acute diverticulitis. No evidence for bowel obstruction. Normal
morphology to the stomach. No free fluid or free intraperitoneal
air.

Vascular/Lymphatic: Normal caliber abdominal aorta. Peripheral
calcified atherosclerotic plaque. No retroperitoneal
lymphadenopathy.

Other: Prostate unremarkable.

Musculoskeletal: Lumbar spine degenerative changes. No aggressive or
acute appearing osseous lesions.
IMPRESSION: There is a 4 mm stone within the distal right ureter resulting in
mild right hydroureteronephrosis.

Multiple additional bilateral renal stones are demonstrated.

## 2017-08-28 NOTE — Progress Notes (Signed)
08/29/2017 4:29 PM   Ian Hubbard 09/05/1950 [id number]  Referring provider: Ellamae Sia, MD [address] [place],  [address]  Chief Complaint  Patient presents with  . Testicular Mass    HPI: Patient is 67 year old Caucasian male with a history of nephrolithiasis who was referred by Dr. Quay Burow for right testicular mass  He states on June 1st, he noted a right testicular mass.  He states it is not painful.  It has not changed size. Patient denies any gross hematuria, dysuria or suprapubic/flank pain.  Patient denies any fevers, chills, nausea or vomiting.   He states that he has not injured his scrotal area or had infections of the testicles.  His father had prostate cancer.      PMH: Past Medical History:  Diagnosis Date  . Anxiety   . Bilateral kidney stones   . BPH (benign prostatic hyperplasia)   . Depression   . DJD (degenerative joint disease)    spine  . Heart murmur   . History of nephrolithiasis   . HLD (hyperlipidemia)   . Hypertension   . Incomplete bladder emptying     Surgical History: Past Surgical History:  Procedure Laterality Date  . CARDIAC SURGERY    . TONSILLECTOMY      Home Medications:  Allergies as of 08/29/2017   No Known Allergies     Medication List        Accurate as of 08/29/17  4:29 PM. Always use your most recent med list.          acetaminophen 325 MG tablet Commonly known as:  TYLENOL Take 650 mg by mouth. Reported on 05/18/2015   aspirin 81 MG chewable tablet Chew 324 mg by mouth.   atorvastatin 40 MG tablet Commonly known as:  LIPITOR Take by mouth. Reported on 04/16/2015   DULoxetine 20 MG capsule Commonly known as:  CYMBALTA Take 20 mg by mouth.   polyethylene glycol powder powder Commonly known as:  GLYCOLAX/MIRALAX Reported on 05/18/2015   quinapril 10 MG tablet Commonly known as:  ACCUPRIL Take by mouth. Reported on 05/18/2015   TOPROL XL 25 MG 24 hr tablet Generic  drug:  metoprolol succinate Take 25 mg by mouth.       Allergies: No Known Allergies  Family History: Family History  Problem Relation Age of Onset  . Prostate cancer Father   . Kidney disease Father     Social History:  reports that he has never smoked. He does not have any smokeless tobacco history on file. He reports that he drinks alcohol. He reports that he has current or past drug history. Drug: Marijuana.  ROS: UROLOGY Frequent Urination?: No Hard to postpone urination?: No Burning/pain with urination?: No Get up at night to urinate?: No Leakage of urine?: No Urine stream starts and stops?: No Trouble starting stream?: No Do you have to strain to urinate?: No Blood in urine?: No Urinary tract infection?: No Sexually transmitted disease?: No Injury to kidneys or bladder?: No Painful intercourse?: No Weak stream?: No Erection problems?: No Penile pain?: No  Gastrointestinal Nausea?: No Vomiting?: No Indigestion/heartburn?: No Diarrhea?: No Constipation?: No  Constitutional Fever: No Night sweats?: No Weight loss?: No Fatigue?: No  Skin Skin rash/lesions?: No Itching?: No  Eyes Blurred vision?: No Double vision?: No  Ears/Nose/Throat Sore throat?: No Sinus problems?: No  Hematologic/Lymphatic Swollen glands?: No Easy bruising?: No  Cardiovascular Leg swelling?: No Chest pain?: No  Respiratory Cough?: No Shortness  of breath?: No  Endocrine Excessive thirst?: No  Musculoskeletal Back pain?: No Joint pain?: No  Neurological Headaches?: No Dizziness?: Yes  Psychologic Depression?: Yes Anxiety?: Yes  Physical Exam: BP 100/64   Pulse 70   Ht 5\' 6"  (1.676 m)   Wt 155 lb (70.3 kg)   BMI 25.02 kg/m   Constitutional:  Well nourished. Alert and oriented, No acute distress. HEENT: [place] AT, moist mucus membranes.  Trachea midline, no masses. Cardiovascular: No clubbing, cyanosis, or edema. Respiratory: Normal respiratory effort,  no increased work of breathing. GI: Abdomen is soft, non tender, non distended, no abdominal masses. Liver and spleen not palpable.  No hernias appreciated.  Stool sample for occult testing is not indicated.   GU: No CVA tenderness.  No bladder fullness or masses.  Patient with circumcised phallus.  Urethral meatus is patent.  No penile discharge. No penile lesions or rashes. Scrotum without lesions, cysts, rashes and/or edema.  Testicles are located scrotally bilaterally. No masses are appreciated in left testicle.  Left epididymis are normal.  Right epididymis is indurated and non tender and right testicle is hard and non tender.   Rectal: Patient with  normal sphincter tone. Anus and perineum without scarring or rashes. No rectal masses are appreciated. Prostate is approximately 50 grams, no nodules are appreciated. Seminal vesicles are normal. Skin: No rashes, bruises or suspicious lesions. Lymph: No cervical or inguinal adenopathy. Neurologic: Grossly intact, no focal deficits, moving all 4 extremities. Psychiatric: Normal mood and affect.  Laboratory Data: Lab Results  Component Value Date   WBC 12.6 (H) 03/14/2015   HGB 12.6 (L) 03/14/2015   HCT 37.8 (L) 03/14/2015   MCV 86.5 03/14/2015   PLT 297 03/14/2015    Lab Results  Component Value Date   CREATININE 1.16 03/14/2015    No results found for: PSA  No results found for: TESTOSTERONE  No results found for: HGBA1C  No results found for: TSH  No results found for: CHOL, HDL, CHOLHDL, VLDL, LDLCALC  Lab Results  Component Value Date   AST 28 03/14/2015   Lab Results  Component Value Date   ALT 22 03/14/2015   No components found for: ALKALINEPHOPHATASE No components found for: BILIRUBINTOTAL  No results found for: ESTRADIOL  Urinalysis    Component Value Date/Time   COLORURINE YELLOW (A) 03/14/2015 1016   APPEARANCEUR Clear 04/16/2015 1058   LABSPEC 1.024 03/14/2015 1016   PHURINE 6.0 03/14/2015 1016    GLUCOSEU Negative 04/16/2015 [place] 03/14/2015 1016   BILIRUBINUR Negative 04/16/2015 1058   KETONESUR 2+ (A) 03/14/2015 1016   PROTEINUR Negative 04/16/2015 1058   PROTEINUR NEGATIVE 03/14/2015 1016   NITRITE Negative 04/16/2015 1058   NITRITE NEGATIVE 03/14/2015 1016   LEUKOCYTESUR Negative 04/16/2015 1058    I have reviewed the labs.     Assessment & Plan:    1. Testicular mass Explained to the patient that the abnormal right testicular exam may be the result of testicular cancer  we will schedule scrotal ultrasound for further evaluation He will return for discussion of his scrotal ultrasound results  Return for scrotal ultrasound report .  These notes generated with voice recognition software. I apologize for typographical errors.  Zara Council, PA-C  [facility] [address]  [place], [place] [address] [phone]

## 2017-08-29 ENCOUNTER — Encounter: Payer: Self-pay | Admitting: Urology

## 2017-08-29 ENCOUNTER — Ambulatory Visit (INDEPENDENT_AMBULATORY_CARE_PROVIDER_SITE_OTHER): Payer: Medicare Other | Admitting: Urology

## 2017-08-29 VITALS — BP 100/64 | HR 70 | Ht 66.0 in | Wt 155.0 lb

## 2017-08-29 DIAGNOSIS — N509 Disorder of male genital organs, unspecified: Secondary | ICD-10-CM

## 2017-08-29 DIAGNOSIS — N5089 Other specified disorders of the male genital organs: Secondary | ICD-10-CM

## 2017-09-05 NOTE — Progress Notes (Deleted)
09/06/2017 10:21 PM   Ian Hubbard 1951-02-06 [id number]  Referring provider: Ellamae Sia, MD [address] [place], [place] [address]  No chief complaint on file.   HPI: Patient is 67 year old Caucasian male with a history of nephrolithiasis who was referred by Dr. Quay Burow for right testicular mass  He states on June 1st, he noted a right testicular mass.  He states it is not painful.  It has not changed size. Patient denies any gross hematuria, dysuria or suprapubic/flank pain.  Patient denies any fevers, chills, nausea or vomiting.   He states that he has not injured his scrotal area or had infections of the testicles.  His father had prostate cancer.      PMH: Past Medical History:  Diagnosis Date  . Anxiety   . Bilateral kidney stones   . BPH (benign prostatic hyperplasia)   . Depression   . DJD (degenerative joint disease)    spine  . Heart murmur   . History of nephrolithiasis   . HLD (hyperlipidemia)   . Hypertension   . Incomplete bladder emptying     Surgical History: Past Surgical History:  Procedure Laterality Date  . CARDIAC SURGERY    . TONSILLECTOMY      Home Medications:  Allergies as of 09/06/2017   No Known Allergies     Medication List        Accurate as of 09/05/17 10:21 PM. Always use your most recent med list.          acetaminophen 325 MG tablet Commonly known as:  TYLENOL Take 650 mg by mouth. Reported on 05/18/2015   aspirin 81 MG chewable tablet Chew 324 mg by mouth.   atorvastatin 40 MG tablet Commonly known as:  LIPITOR Take by mouth. Reported on 04/16/2015   DULoxetine 20 MG capsule Commonly known as:  CYMBALTA Take 20 mg by mouth.   polyethylene glycol powder powder Commonly known as:  GLYCOLAX/MIRALAX Reported on 05/18/2015   quinapril 10 MG tablet Commonly known as:  ACCUPRIL Take by mouth. Reported on 05/18/2015   TOPROL XL 25 MG 24 hr tablet Generic drug:  metoprolol succinate Take 25  mg by mouth.       Allergies: No Known Allergies  Family History: Family History  Problem Relation Age of Onset  . Prostate cancer Father   . Kidney disease Father     Social History:  reports that he has never smoked. He has never used smokeless tobacco. He reports that he drinks alcohol. He reports that he has current or past drug history. Drug: Marijuana.  ROS:                                        Physical Exam: There were no vitals taken for this visit.  Constitutional:  Well nourished. Alert and oriented, No acute distress. HEENT:  AT, moist mucus membranes.  Trachea midline, no masses. Cardiovascular: No clubbing, cyanosis, or edema. Respiratory: Normal respiratory effort, no increased work of breathing. GI: Abdomen is soft, non tender, non distended, no abdominal masses. Liver and spleen not palpable.  No hernias appreciated.  Stool sample for occult testing is not indicated.   GU: No CVA tenderness.  No bladder fullness or masses.  Patient with circumcised phallus.  Urethral meatus is patent.  No penile discharge. No penile lesions or rashes. Scrotum without lesions, cysts, rashes  and/or edema.  Testicles are located scrotally bilaterally. No masses are appreciated in left testicle.  Left epididymis are normal.  Right epididymis is indurated and non tender and right testicle is hard and non tender.   Rectal: Patient with  normal sphincter tone. Anus and perineum without scarring or rashes. No rectal masses are appreciated. Prostate is approximately 50 grams, no nodules are appreciated. Seminal vesicles are normal. Skin: No rashes, bruises or suspicious lesions. Lymph: No cervical or inguinal adenopathy. Neurologic: Grossly intact, no focal deficits, moving all 4 extremities. Psychiatric: Normal mood and affect.  Laboratory Data: Lab Results  Component Value Date   WBC 12.6 (H) 03/14/2015   HGB 12.6 (L) 03/14/2015   HCT 37.8 (L) 03/14/2015    MCV 86.5 03/14/2015   PLT 297 03/14/2015    Lab Results  Component Value Date   CREATININE 1.16 03/14/2015    No results found for: PSA  No results found for: TESTOSTERONE  No results found for: HGBA1C  No results found for: TSH  No results found for: CHOL, HDL, CHOLHDL, VLDL, LDLCALC  Lab Results  Component Value Date   AST 28 03/14/2015   Lab Results  Component Value Date   ALT 22 03/14/2015   No components found for: ALKALINEPHOPHATASE No components found for: BILIRUBINTOTAL  No results found for: ESTRADIOL  Urinalysis    Component Value Date/Time   COLORURINE YELLOW (A) 03/14/2015 1016   APPEARANCEUR Clear 04/16/2015 1058   LABSPEC 1.024 03/14/2015 1016   PHURINE 6.0 03/14/2015 1016   GLUCOSEU Negative 04/16/2015 [place] 03/14/2015 1016   BILIRUBINUR Negative 04/16/2015 1058   KETONESUR 2+ (A) 03/14/2015 1016   PROTEINUR Negative 04/16/2015 1058   PROTEINUR NEGATIVE 03/14/2015 1016   NITRITE Negative 04/16/2015 1058   NITRITE NEGATIVE 03/14/2015 1016   LEUKOCYTESUR Negative 04/16/2015 1058    I have reviewed the labs.     Assessment & Plan:    1. Testicular mass Explained to the patient that the abnormal right testicular exam may be the result of testicular cancer  we will schedule scrotal ultrasound for further evaluation He will return for discussion of his scrotal ultrasound results  No follow-ups on file.  These notes generated with voice recognition software. I apologize for typographical errors.  Zara Council, PA-C  [facility] [address]  [place], [place] [address] [phone]

## 2017-09-06 ENCOUNTER — Ambulatory Visit
Admission: RE | Admit: 2017-09-06 | Discharge: 2017-09-06 | Disposition: A | Discharge status: Home / Self Care | Payer: Medicare Other | Source: Ambulatory Visit | Attending: Urology | Admitting: Urology

## 2017-09-06 ENCOUNTER — Ambulatory Visit: Payer: Medicare Other | Admitting: Urology

## 2017-09-06 DIAGNOSIS — N5089 Other specified disorders of the male genital organs: Secondary | ICD-10-CM | POA: Insufficient documentation

## 2017-09-06 DIAGNOSIS — N509 Disorder of male genital organs, unspecified: Secondary | ICD-10-CM | POA: Diagnosis present

## 2017-09-08 ENCOUNTER — Other Ambulatory Visit: Payer: Self-pay | Admitting: Urology

## 2017-09-08 DIAGNOSIS — C6210 Malignant neoplasm of unspecified descended testis: Secondary | ICD-10-CM

## 2017-09-08 NOTE — Progress Notes (Signed)
Tumor markers entered.

## 2017-09-11 ENCOUNTER — Other Ambulatory Visit
Admission: RE | Admit: 2017-09-11 | Discharge: 2017-09-11 | Disposition: A | Discharge status: Home / Self Care | Payer: Medicare Other | Source: Ambulatory Visit | Attending: Urology | Admitting: Urology

## 2017-09-11 ENCOUNTER — Other Ambulatory Visit: Payer: Medicare Other

## 2017-09-11 ENCOUNTER — Encounter: Payer: Self-pay | Admitting: Urology

## 2017-09-11 DIAGNOSIS — C6210 Malignant neoplasm of unspecified descended testis: Secondary | ICD-10-CM | POA: Diagnosis present

## 2017-09-11 LAB — LACTATE DEHYDROGENASE: LDH: 142 U/L (ref 98–192)

## 2017-09-11 NOTE — Addendum Note (Signed)
Addended by: Santiago Bur on: 09/11/2017 09:59 AM   Modules accepted: Orders

## 2017-09-11 NOTE — Addendum Note (Signed)
Addended by: Santiago Bur on: 09/11/2017 09:58 AM   Modules accepted: Orders

## 2017-09-12 ENCOUNTER — Ambulatory Visit (INDEPENDENT_AMBULATORY_CARE_PROVIDER_SITE_OTHER): Payer: Medicare Other | Admitting: Urology

## 2017-09-12 ENCOUNTER — Encounter: Payer: Self-pay | Admitting: Urology

## 2017-09-12 VITALS — BP 121/76 | HR 75 | Ht 66.0 in | Wt 153.0 lb

## 2017-09-12 DIAGNOSIS — N5089 Other specified disorders of the male genital organs: Secondary | ICD-10-CM

## 2017-09-12 DIAGNOSIS — N509 Disorder of male genital organs, unspecified: Secondary | ICD-10-CM

## 2017-09-12 LAB — BETA HCG QUANT (REF LAB): HCG QUANT: 1 m[IU]/mL (ref 0–3)

## 2017-09-12 LAB — AFP TUMOR MARKER: AFP, SERUM, TUMOR MARKER: 2.3 ng/mL (ref 0.0–8.3)

## 2017-09-14 ENCOUNTER — Encounter: Payer: Self-pay | Admitting: Urology

## 2017-09-14 NOTE — Progress Notes (Signed)
09/12/2017 9:29 AM   Ladona Mow Jul 24, 1950 [id number]  Referring provider: Ellamae Sia, MD [address] [place], [place] [address]  Chief Complaint  Patient presents with  . Results    discuss surgery    HPI: 67 year old male presents for follow-up of a right testicular mass.  Refer to Shannon's note of 08/29/2017.  His testicular tumor markers were normal.  Scrotal ultrasound performed on 09/06/2017 showed multiple heterogeneous masses with increased vascularity.  The largest lesion measures 2.2 x 1.8 x 1.8 cm.    PMH: Past Medical History:  Diagnosis Date  . Anxiety   . Bilateral kidney stones   . BPH (benign prostatic hyperplasia)   . Depression   . DJD (degenerative joint disease)    spine  . Heart murmur   . History of nephrolithiasis   . HLD (hyperlipidemia)   . Hypertension   . Incomplete bladder emptying     Surgical History: Past Surgical History:  Procedure Laterality Date  . CARDIAC SURGERY    . TONSILLECTOMY      Home Medications:  Allergies as of 09/12/2017   No Known Allergies     Medication List        Accurate as of 09/12/17 11:59 PM. Always use your most recent med list.          acetaminophen 325 MG tablet Commonly known as:  TYLENOL Take 650 mg by mouth. Reported on 05/18/2015   aspirin 81 MG chewable tablet Chew 324 mg by mouth.   atorvastatin 40 MG tablet Commonly known as:  LIPITOR Take by mouth. Reported on 04/16/2015   DULoxetine 20 MG capsule Commonly known as:  CYMBALTA Take 20 mg by mouth.   polyethylene glycol powder powder Commonly known as:  GLYCOLAX/MIRALAX Reported on 05/18/2015   quinapril 10 MG tablet Commonly known as:  ACCUPRIL Take by mouth. Reported on 05/18/2015   TOPROL XL 25 MG 24 hr tablet Generic drug:  metoprolol succinate Take 25 mg by mouth.       Allergies: No Known Allergies  Family History: Family History  Problem Relation Age of Onset  . Prostate cancer Father     . Kidney disease Father     Social History:  reports that he has never smoked. He has never used smokeless tobacco. He reports that he drinks alcohol. He reports that he has current or past drug history. Drug: Marijuana.  ROS: UROLOGY Frequent Urination?: No Hard to postpone urination?: No Burning/pain with urination?: No Get up at night to urinate?: Yes Leakage of urine?: No Urine stream starts and stops?: No Trouble starting stream?: No Do you have to strain to urinate?: No Blood in urine?: No Urinary tract infection?: No Sexually transmitted disease?: No Injury to kidneys or bladder?: No Painful intercourse?: No Weak stream?: No Erection problems?: No Penile pain?: No  Gastrointestinal Nausea?: No Vomiting?: No Indigestion/heartburn?: No Diarrhea?: No Constipation?: No  Constitutional Fever: No Night sweats?: No Weight loss?: No Fatigue?: No  Skin Skin rash/lesions?: No Itching?: No  Eyes Blurred vision?: Yes Double vision?: No  Ears/Nose/Throat Sore throat?: No Sinus problems?: No  Hematologic/Lymphatic Swollen glands?: No Easy bruising?: No  Cardiovascular Leg swelling?: No Chest pain?: No  Respiratory Cough?: No Shortness of breath?: No  Endocrine Excessive thirst?: Yes  Musculoskeletal Back pain?: No Joint pain?: No  Neurological Headaches?: No Dizziness?: No  Psychologic Depression?: Yes Anxiety?: No  Physical Exam: BP 121/76   Pulse 75   Ht 5\' 6"  (  1.676 m)   Wt 153 lb (69.4 kg)   BMI 24.69 kg/m   Constitutional:  Alert and oriented, No acute distress. HEENT: [place] AT, moist mucus membranes.  Trachea midline, no masses. Cardiovascular: No clubbing, cyanosis, or edema.  RRR Respiratory: Normal respiratory effort, no increased work of breathing.  Lungs clear GI: Abdomen is soft, nontender, nondistended, no abdominal masses GU: No CVA tenderness.  Firm right testis upper to mid pole. Lymph: No cervical or inguinal  lymphadenopathy. Skin: No rashes, bruises or suspicious lesions. Neurologic: Grossly intact, no focal deficits, moving all 4 extremities. Psychiatric: Normal mood and affect.   Pertinent Imaging: Scrotal ultrasound was personally reviewed.   Assessment & Plan:   67 year old male with a right testicular mass.  Even though he is out of the range of testicular cancer his exam and ultrasound findings are suspicious for testicular carcinoma.  This is not the typical appearance of lymphoma of the testis.  Findings were discussed in detail and I reviewed the ultrasound with Mr. Allen.  I recommended scheduling a right radical orchiectomy.  The procedure was discussed in detail including potential risks of bleeding, infection and chronic pain.  Based on his age he inquired about surveillance which was not recommended.  He was also informed the possibility of hypogonadism.  He would like to think this over and states he will call back regarding scheduling.  I also offered him a second opinion.    Abbie Sons, [place] [address], [place], [place] [address] [phone]

## 2017-09-18 ENCOUNTER — Telehealth: Payer: Self-pay | Admitting: Radiology

## 2017-09-18 NOTE — Telephone Encounter (Signed)
Patient states he has an appointment with [facility] on 10/23/2017 for a second opinion. He will confirm whether he wants to proceed with radial orchiectomy with Dr Bernardo Heater after that appointment.

## 2017-09-19 NOTE — Telephone Encounter (Signed)
Noted, thanks!

## 2021-07-17 ENCOUNTER — Emergency Department
Admission: EM | Admit: 2021-07-17 | Discharge: 2021-07-17 | Disposition: A | Discharge status: Home / Self Care | Payer: Medicare Other | Attending: Emergency Medicine | Admitting: Emergency Medicine

## 2021-07-17 ENCOUNTER — Other Ambulatory Visit: Payer: Self-pay

## 2021-07-17 ENCOUNTER — Emergency Department: Payer: Medicare Other

## 2021-07-17 ENCOUNTER — Encounter: Payer: Self-pay | Admitting: Emergency Medicine

## 2021-07-17 DIAGNOSIS — G450 Vertebro-basilar artery syndrome: Secondary | ICD-10-CM | POA: Diagnosis not present

## 2021-07-17 DIAGNOSIS — R42 Dizziness and giddiness: Secondary | ICD-10-CM | POA: Diagnosis present

## 2021-07-17 DIAGNOSIS — R001 Bradycardia, unspecified: Secondary | ICD-10-CM | POA: Diagnosis not present

## 2021-07-17 DIAGNOSIS — H532 Diplopia: Secondary | ICD-10-CM | POA: Diagnosis present

## 2021-07-17 DIAGNOSIS — I10 Essential (primary) hypertension: Secondary | ICD-10-CM | POA: Insufficient documentation

## 2021-07-17 LAB — LIPASE, BLOOD: Lipase: 76 U/L — ABNORMAL HIGH (ref 11–51)

## 2021-07-17 LAB — URINALYSIS, ROUTINE W REFLEX MICROSCOPIC
Bilirubin Urine: NEGATIVE
Glucose, UA: NEGATIVE mg/dL
Hgb urine dipstick: NEGATIVE
Ketones, ur: NEGATIVE mg/dL
Leukocytes,Ua: NEGATIVE
Nitrite: NEGATIVE
Protein, ur: NEGATIVE mg/dL
Specific Gravity, Urine: 1.006 (ref 1.005–1.030)
pH: 6 (ref 5.0–8.0)

## 2021-07-17 LAB — CBC
HCT: 43.4 % (ref 39.0–52.0)
Hemoglobin: 14.3 g/dL (ref 13.0–17.0)
MCH: 30.3 pg (ref 26.0–34.0)
MCHC: 32.9 g/dL (ref 30.0–36.0)
MCV: 91.9 fL (ref 80.0–100.0)
Platelets: 239 10*3/uL (ref 150–400)
RBC: 4.72 MIL/uL (ref 4.22–5.81)
RDW: 13.3 % (ref 11.5–15.5)
WBC: 9.5 10*3/uL (ref 4.0–10.5)
nRBC: 0 % (ref 0.0–0.2)

## 2021-07-17 LAB — LIPID PANEL
Cholesterol: 200 mg/dL (ref 0–200)
HDL: 45 mg/dL (ref >40)
LDL Cholesterol: 134 mg/dL — ABNORMAL HIGH (ref 0–99)
Total CHOL/HDL Ratio: 4.4 RATIO
Triglycerides: 106 mg/dL (ref <150)
VLDL: 21 mg/dL (ref 0–40)

## 2021-07-17 LAB — BASIC METABOLIC PANEL
Anion gap: 6 (ref 5–15)
BUN: 16 mg/dL (ref 8–23)
CO2: 25 mmol/L (ref 22–32)
Calcium: 9.1 mg/dL (ref 8.9–10.3)
Chloride: 105 mmol/L (ref 98–111)
Creatinine, Ser: 1.36 mg/dL — ABNORMAL HIGH (ref 0.61–1.24)
GFR, Estimated: 55 mL/min — ABNORMAL LOW (ref >60)
Glucose, Bld: 102 mg/dL — ABNORMAL HIGH (ref 70–99)
Potassium: 4.4 mmol/L (ref 3.5–5.1)
Sodium: 136 mmol/L (ref 135–145)

## 2021-07-17 LAB — HEPATIC FUNCTION PANEL
ALT: 12 U/L (ref 0–44)
AST: 19 U/L (ref 15–41)
Albumin: 3.8 g/dL (ref 3.5–5.0)
Alkaline Phosphatase: 68 U/L (ref 38–126)
Bilirubin, Direct: <0.1 mg/dL (ref 0.0–0.2)
Total Bilirubin: 0.3 mg/dL (ref 0.3–1.2)
Total Protein: 6.9 g/dL (ref 6.5–8.1)

## 2021-07-17 LAB — HEMOGLOBIN A1C
Hgb A1c MFr Bld: 5.3 % (ref 4.8–5.6)
Mean Plasma Glucose: 105.41 mg/dL

## 2021-07-17 LAB — TROPONIN I (HIGH SENSITIVITY): Troponin I (High Sensitivity): 3 ng/L (ref <18)

## 2021-07-17 MED ORDER — ATORVASTATIN CALCIUM 40 MG PO TABS: 40.0000 mg | ORAL_TABLET | Freq: Every day | ORAL | 11 refills | Status: DC

## 2021-07-17 MED ORDER — CLOPIDOGREL BISULFATE 75 MG PO TABS: 75.0000 mg | ORAL_TABLET | Freq: Every day | ORAL | 3 refills | Status: DC

## 2021-07-17 MED ORDER — ASPIRIN EC 81 MG PO TBEC: 81.0000 mg | DELAYED_RELEASE_TABLET | Freq: Every day | ORAL | 11 refills | Status: AC

## 2021-07-17 MED ORDER — ASPIRIN EC 81 MG PO TBEC: 81.0000 mg | DELAYED_RELEASE_TABLET | Freq: Every day | ORAL | 2 refills | Status: DC

## 2021-07-17 MED ORDER — CLOPIDOGREL BISULFATE 75 MG PO TABS
300.0000 mg | ORAL_TABLET | Freq: Once | ORAL | Status: AC
  Administered 2021-07-17: 300 mg via ORAL
  Filled 2021-07-17: qty 4

## 2021-07-17 MED ORDER — IOHEXOL 350 MG/ML SOLN
75.0000 mL | Freq: Once | INTRAVENOUS | Status: AC | PRN
  Administered 2021-07-17: 75 mL via INTRAVENOUS
  Filled 2021-07-17: qty 75

## 2021-07-17 MED ORDER — ASPIRIN 81 MG PO CHEW
324.0000 mg | CHEWABLE_TABLET | Freq: Once | ORAL | Status: AC
  Administered 2021-07-17: 324 mg via ORAL
  Filled 2021-07-17: qty 4

## 2021-07-17 MED ORDER — CLOPIDOGREL BISULFATE 75 MG PO TABS: 75.0000 mg | ORAL_TABLET | Freq: Every day | ORAL | 3 refills | Status: AC

## 2021-07-17 MED ORDER — GADOBUTROL 1 MMOL/ML IV SOLN
6.0000 mL | Freq: Once | INTRAVENOUS | Status: AC | PRN
  Administered 2021-07-17: 6 mL via INTRAVENOUS
  Filled 2021-07-17: qty 6

## 2021-07-17 MED ORDER — ATORVASTATIN CALCIUM 40 MG PO TABS: 40.0000 mg | ORAL_TABLET | Freq: Every day | ORAL | 11 refills | Status: AC

## 2021-07-17 NOTE — ED Notes (Signed)
Pt in MRI.

## 2021-07-17 NOTE — ED Notes (Signed)
Pt to ED for episode of dizziness and fall yesterday around 3pm. He does not remember falling but woke up on the ground. He felt dizzy before and after. He denies N/V, vision changes with episode yesterday. Has laceration to forehead with swelling.  ?Had similar episode 2 and 4 weeks ago. Has had other episodes in past with sudden blurry vision and vomiting. Has not had head MRI that he knows of.  ?Alert, oriented at this time with steady gait. ?

## 2021-07-17 NOTE — Discharge Instructions (Addendum)
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  This is the cause of your intermittent dizziness.  To treat this please start taking both aspirin 81 mg and 75 mg of Plavix daily.  You should take 81 mg of aspirin for the rest of your life.  You will have to be on Plavix for at least 90 days and then your primary care provider and neurologist will determine whether you should take it for longer than this.  Please also start taking atorvastatin 40 mg daily. ? ?You will need to have an echocardiogram which is an ultrasound of your heart as part of your stroke work-up.  I have referred you to cardiology.  Please also closely follow-up with neurology. ? ?You develop any new neurologic symptoms especially if they are persisting please return to the emergency department.  ?

## 2021-07-17 NOTE — Plan of Care (Addendum)
Discussed briefly with Dr. Camila Li.  ? ?MRI diffusion images and CTA personally reviewed. No acute stroke but CTA in combination with sx as described to me c/w vertebrobasilar hypoperfusion. Would start patient on DAPT as soon as possible after confirming no significant bleeding issues currently ? ?Recommend ?- 300 mg plavix load, 75 mg daily for at least 90 days thereafter ?- Continue home aspirin 81 mg daily lifelong ?- Close BP monitoring, if patient symptomatic with normotension may need higher BP goal long-term ?- Ideally full stroke workup with ECHO, A1c, lipid panel if patient agreeable to admission  ? - goal A1c < 7%, LDL < 70 ?- Neurology will follow if patient admitted ? ?Past Medical History:  ?Diagnosis Date  ? Anxiety   ? Bilateral kidney stones   ? BPH (benign prostatic hyperplasia)   ? Depression   ? DJD (degenerative joint disease)   ? spine  ? Heart murmur   ? History of nephrolithiasis   ? HLD (hyperlipidemia)   ? Hypertension   ? Incomplete bladder emptying   ?  ?Past Surgical History:  ?Procedure Laterality Date  ? CARDIAC SURGERY    ? TONSILLECTOMY    ? ?Current Outpatient Medications  ?Medication Instructions  ? acetaminophen (TYLENOL) 650 mg, Oral, Reported on 05/18/2015  ? aspirin 324 mg, Oral  ? atorvastatin (LIPITOR) 40 MG tablet Oral, Reported on 04/16/2015  ? DULoxetine (CYMBALTA) 20 mg, Oral  ? metoprolol succinate (TOPROL XL) 25 mg, Oral  ? polyethylene glycol powder (GLYCOLAX/MIRALAX) powder Reported on 05/18/2015  ? quinapril (ACCUPRIL) 10 MG tablet Oral, Reported on 05/18/2015  ? ?No charge plan of care note ?

## 2021-07-17 NOTE — ED Notes (Signed)
Pt in CT.

## 2021-07-17 NOTE — ED Provider Notes (Signed)
And like ? ?[facility] ?Provider Note ? ? ? Event Date/Time  ? First MD Initiated Contact with Patient 07/17/21 1104   ?  (approximate) ? ? ?History  ? ?Dizziness and Weakness ? ? ?HPI ? ?Ian Hubbard is a 71 y.o. male with past medical history of anxiety, mitral valve repair, hypertension depression BPH who presents with dizziness.  Patient notes that over the last several weeks to months he has had intermittent dizzy spells.  Yesterday patient was working in his van sitting down when he started to feel dizzy.  Describes the dizzy feeling as feeling off balance and like he is not able to walk.  He stood up and stood for quite some time to see if he would feel better and then woke up on the ground.  Does not remember actually passing out.  Denies feeling of lightheadedness he had no associated numbness tingling weakness diplopia dysarthria chest pain shortness of breath or palpitations with this.  He was able to get up and walk into the house but continued to feel this dizzy feeling he was unsteady.  He went to his recliner and back and then eventually laid down in the recliner and felt better.  He notes that he has had some similar episodes over the last several weeks.  Has never passed out with it before.  Generally describes this off-balance feeling that comes over him quickly last for about an hour and then resolves.  There was one episode when he was walking in his yard when he started having double vision that lasted for several minutes but then resolved.  The episodes are typically accompanied by nausea but no vomiting.  He otherwise denies neurologic symptoms.  Has had about 20 pound weight loss over the last several months unintentionally.  No history of stroke or TIA. ?  ? ?Past Medical History:  ?Diagnosis Date  ? Anxiety   ? Bilateral kidney stones   ? BPH (benign prostatic hyperplasia)   ? Depression   ? DJD (degenerative joint disease)   ? spine  ? Heart murmur   ?  History of nephrolithiasis   ? HLD (hyperlipidemia)   ? Hypertension   ? Incomplete bladder emptying   ? ? ?Patient Active Problem List  ? Diagnosis Date Noted  ? Kidney stones 05/18/2015  ? BPH with obstruction/lower urinary tract symptoms 04/20/2015  ? Hydronephrosis with urinary obstruction due to ureteral calculus 04/20/2015  ? Right ureteral stone 04/20/2015  ? 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 Clinical depression 08/21/2014  ? H/O prosthetic heart valve 08/21/2014  ? MI (mitral incompetence) 08/21/2014  ? Epidural abscess (embolic) of spinal cord 97/98/9211  ? Subacute bacterial endocarditis 06/21/2014  ? LBP (low back pain) 06/15/2014  ? Diverticulitis 06/14/2014  ? Febrile 06/14/2014  ? BP (high blood pressure) 06/14/2014  ? Cutaneous abscess 06/14/2014  ? H/O gastrointestinal hemorrhage 04/14/2014  ? ? ? ?Physical Exam  ?Triage Vital Signs: ?ED Triage Vitals  ?Enc Vitals Group  ?   BP 07/17/21 0939 129/90  ?   Pulse Rate 07/17/21 0939 (!) 57  ?   Resp 07/17/21 0939 19  ?   Temp 07/17/21 0939 (!) 97.4 ?F (36.3 ?C)  ?   Temp src --   ?   SpO2 07/17/21 0939 99 %  ?   Weight 07/17/21 0935 140 lb (63.5 kg)  ?   Height 07/17/21 0935 '5\' 6"'$  (1.676 m)  ?   Head Circumference --   ?  Peak Flow --   ?   Pain Score 07/17/21 0935 0  ?   Pain Loc --   ?   Pain Edu? --   ?   Excl. in [place]? --   ? ? ?Most recent vital signs: ?Vitals:  ? 07/17/21 1445 07/17/21 1753  ?BP: 125/82 127/83  ?Pulse: 66 67  ?Resp: 16 16  ?Temp:    ?SpO2: 100% 100%  ? ? ? ?General: Awake, no distress.  ?CV:  Good peripheral perfusion.  ?Resp:  Normal effort.  ?Abd:  No distention.  Tender throughout ?Neuro:             Awake, Alert, Oriented x 3  ?Other:  Aox3, nml speech  ?PERRL, EOMI, face symmetric, nml tongue movement  ?5/5 strength in the BL upper and lower extremities  ?Sensation grossly intact in the BL upper and lower extremities  ?Finger-nose-finger intact BL ? ? ? ?ED Results / Procedures / Treatments  ?Labs ?(all labs ordered  are listed, but only abnormal results are displayed) ?Labs Reviewed  ?BASIC METABOLIC PANEL - Abnormal; Notable for the following components:  ?    Result Value  ? Glucose, Bld 102 (*)   ? Creatinine, Ser 1.36 (*)   ? GFR, Estimated 55 (*)   ? All other components within normal limits  ?URINALYSIS, ROUTINE W REFLEX MICROSCOPIC - Abnormal; Notable for the following components:  ? Color, Urine STRAW (*)   ? APPearance CLEAR (*)   ? All other components within normal limits  ?LIPASE, BLOOD - Abnormal; Notable for the following components:  ? Lipase 76 (*)   ? All other components within normal limits  ?CBC  ?HEPATIC FUNCTION PANEL  ?LIPID PANEL  ?HEMOGLOBIN A1C  ?TROPONIN I (HIGH SENSITIVITY)  ? ? ? ?EKG ?EKG interpreted by myself shows normal sinus rhythm with PACs normal axis normal intervals, no acute ischemic changes ? ? ? ?RADIOLOGY ?I reviewed the CT scan of the brain which does not show any acute intracranial process; agree with radiology report  ? ? ? ?PROCEDURES: ? ?Critical Care performed: No ? ?Procedures ? ? ?MEDICATIONS ORDERED IN ED: ?Medications  ?gadobutrol (GADAVIST) 1 MMOL/ML injection 6 mL (6 mLs Intravenous Contrast Given 07/17/21 1358)  ?iohexol (OMNIPAQUE) 350 MG/ML injection 75 mL (75 mLs Intravenous Contrast Given 07/17/21 1612)  ?aspirin chewable tablet 324 mg (324 mg Oral Given 07/17/21 1811)  ?clopidogrel (PLAVIX) tablet 300 mg (300 mg Oral Given 07/17/21 1811)  ? ? ? ?IMPRESSION / MDM / ASSESSMENT AND PLAN / ED COURSE  ?I reviewed the triage vital signs and the nursing notes. ?             ?               ? ?Differential diagnosis includes, but is not limited to, syncope due to cardiac arrhythmia, vasovagal, posterior stroke, posterior TIA ? ?This patient is a 71 year old male presents with intermittent episodes of dizziness and an episode of what sounds like syncope yesterday.  He has had several episodes over the last several weeks to months that he describes as feeling off steady and  unbalanced with associated nausea.  1 of these episodes was associated with double vision but otherwise there is no acute neurologic symptoms during these episodes.  Yesterday he had an episode that occurred while he was outside he was sitting and then standing and then woke up on the ground.  Continued to feel dizzy when he got up and went  inside and the symptoms lasted for about an hour and then resolved.  He had some nausea but no vomiting with this no chest pain palpitations or dyspnea.  He denies a feeling of lightheadedness.  This was the first time that he had actually lost consciousness.  Vital signs are notable for bradycardia but otherwise within normal limits.  He appears well on exam and his neurologic exam is nonfocal.  His EKG shows PACs but no acute ischemic changes and is normal sinus rhythm.  Labs are also reassuring.  CT head and neck obtained given the fall yesterday are negative for acute abnormality.  I suspect either syncope versus posterior TIA.  With the episode lasting for an hour and with primary unsteadiness and this episode of double vision I am concerned for potential posterior TIA.  Will obtain MRI brain as well as MRA head and neck to further restratify.  Syncope also certainly possible and the fact that he lost consciousness today does point me in the direction of this being more syncopal and less likely neurologic.  Ultimately if work-up is reassuring today I think that he requires both neurology follow-up for to complete the work-up for TIA as well as cardiology follow-up for long-term monitoring. ? ?  ?MRI of the brain is normal.  MRA does have some concerning possible basilar and vertebral artery occlusions.  They are recommending a CTA to further characterize so we will obtain this. ? ? 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  Discussed with Dr. Curly Shores with neurology who recommends aspirin and  Plavix load as well as dual antiplatelet therapy at discharge.  Also recommend statin.  She said that it would be reasonable to admit him for echo and completion of stroke work-up which would be lipid panel and A1c.  D

## 2021-07-17 NOTE — ED Triage Notes (Signed)
Pt states no issues today and feels fine but yesterday he had a dizzy spell that last awhile and felt really weak. Pt reports he thinks he fell or maybe he hit his head on something because he has a small cut on his head but does not recall how it happened. Pt reports no pain or concerns today and feels fine but is concerned about yesterday and what happened.  ?
